# Patient Record
Sex: Male | Born: 1950 | Race: White | Hispanic: No | Marital: Married | Smoking: Never smoker
Health system: Southern US, Community
[De-identification: ages and names within clinical notes are randomized; demographics above are authoritative.]

## PROBLEM LIST (undated history)

## (undated) DIAGNOSIS — E119 Type 2 diabetes mellitus without complications: Secondary | ICD-10-CM

## (undated) DIAGNOSIS — I82409 Acute embolism and thrombosis of unspecified deep veins of unspecified lower extremity: Secondary | ICD-10-CM

## (undated) DIAGNOSIS — I10 Essential (primary) hypertension: Secondary | ICD-10-CM

## (undated) DIAGNOSIS — E785 Hyperlipidemia, unspecified: Secondary | ICD-10-CM

## (undated) DIAGNOSIS — I2699 Other pulmonary embolism without acute cor pulmonale: Secondary | ICD-10-CM

---

## 1980-10-27 HISTORY — PX: BACK SURGERY: SHX140

## 2000-04-30 ENCOUNTER — Encounter: Payer: Self-pay | Admitting: Gastroenterology

## 2003-09-01 ENCOUNTER — Encounter: Payer: Self-pay | Admitting: Gastroenterology

## 2006-10-27 HISTORY — PX: CERVICAL FUSION: SHX112

## 2006-11-29 ENCOUNTER — Emergency Department (HOSPITAL_COMMUNITY): Admission: EM | Admit: 2006-11-29 | Discharge: 2006-11-29 | Payer: Self-pay | Admitting: Family Medicine

## 2007-09-26 ENCOUNTER — Emergency Department (HOSPITAL_COMMUNITY): Admission: EM | Admit: 2007-09-26 | Discharge: 2007-09-26 | Payer: Self-pay | Admitting: Emergency Medicine

## 2007-09-29 ENCOUNTER — Ambulatory Visit (HOSPITAL_COMMUNITY): Admission: RE | Admit: 2007-09-29 | Discharge: 2007-09-29 | Payer: Self-pay | Admitting: Orthopedic Surgery

## 2007-11-04 ENCOUNTER — Encounter: Admission: RE | Admit: 2007-11-04 | Discharge: 2007-11-26 | Payer: Self-pay | Admitting: Neurology

## 2007-12-23 ENCOUNTER — Inpatient Hospital Stay (HOSPITAL_COMMUNITY): Admission: EM | Admit: 2007-12-23 | Discharge: 2007-12-29 | Payer: Self-pay | Admitting: Emergency Medicine

## 2007-12-24 ENCOUNTER — Ambulatory Visit: Payer: Self-pay | Admitting: Surgery

## 2008-03-09 ENCOUNTER — Ambulatory Visit: Payer: Self-pay | Admitting: Pulmonary Disease

## 2008-03-09 DIAGNOSIS — I10 Essential (primary) hypertension: Secondary | ICD-10-CM | POA: Insufficient documentation

## 2008-03-09 DIAGNOSIS — E119 Type 2 diabetes mellitus without complications: Secondary | ICD-10-CM | POA: Insufficient documentation

## 2008-03-09 DIAGNOSIS — R0602 Shortness of breath: Secondary | ICD-10-CM

## 2008-03-09 DIAGNOSIS — R519 Headache, unspecified: Secondary | ICD-10-CM | POA: Insufficient documentation

## 2008-03-09 DIAGNOSIS — J309 Allergic rhinitis, unspecified: Secondary | ICD-10-CM | POA: Insufficient documentation

## 2008-03-09 DIAGNOSIS — E785 Hyperlipidemia, unspecified: Secondary | ICD-10-CM

## 2008-03-09 DIAGNOSIS — R51 Headache: Secondary | ICD-10-CM

## 2008-03-14 ENCOUNTER — Encounter: Payer: Self-pay | Admitting: Pulmonary Disease

## 2008-03-14 ENCOUNTER — Ambulatory Visit: Payer: Self-pay

## 2008-03-15 ENCOUNTER — Ambulatory Visit: Payer: Self-pay | Admitting: Pulmonary Disease

## 2008-03-16 ENCOUNTER — Encounter: Payer: Self-pay | Admitting: Pulmonary Disease

## 2008-03-17 ENCOUNTER — Ambulatory Visit: Payer: Self-pay | Admitting: Pulmonary Disease

## 2008-03-17 DIAGNOSIS — I2699 Other pulmonary embolism without acute cor pulmonale: Secondary | ICD-10-CM

## 2008-08-15 ENCOUNTER — Ambulatory Visit: Payer: Self-pay | Admitting: Gastroenterology

## 2008-08-15 DIAGNOSIS — R933 Abnormal findings on diagnostic imaging of other parts of digestive tract: Secondary | ICD-10-CM | POA: Insufficient documentation

## 2008-08-21 LAB — CONVERTED CEMR LAB
Basophils Absolute: 0.1 10*3/uL (ref 0.0–0.1)
Basophils Relative: 1 % (ref 0.0–3.0)
CO2: 31 meq/L (ref 19–32)
Calcium: 8.8 mg/dL (ref 8.4–10.5)
Chloride: 101 meq/L (ref 96–112)
Eosinophils Absolute: 0.1 10*3/uL (ref 0.0–0.7)
Eosinophils Relative: 1.7 % (ref 0.0–5.0)
Glucose, Bld: 93 mg/dL (ref 70–99)
Hemoglobin: 12.1 g/dL — ABNORMAL LOW (ref 13.0–17.0)
MCHC: 33.9 g/dL (ref 30.0–36.0)
MCV: 80.1 fL (ref 78.0–100.0)
Monocytes Relative: 7.3 % (ref 3.0–12.0)
Platelets: 184 10*3/uL (ref 150–400)
Potassium: 4 meq/L (ref 3.5–5.1)
Prothrombin Time: 16 s — ABNORMAL HIGH (ref 10.9–13.3)
WBC: 6.8 10*3/uL (ref 4.5–10.5)

## 2008-08-31 ENCOUNTER — Encounter: Admission: RE | Admit: 2008-08-31 | Discharge: 2008-08-31 | Payer: Self-pay | Admitting: Family Medicine

## 2009-06-13 ENCOUNTER — Encounter (INDEPENDENT_AMBULATORY_CARE_PROVIDER_SITE_OTHER): Payer: Self-pay | Admitting: *Deleted

## 2010-05-09 ENCOUNTER — Telehealth: Payer: Self-pay | Admitting: Gastroenterology

## 2010-11-26 NOTE — Progress Notes (Signed)
Summary: Schedule Office Visit  Phone Note Outgoing Call Call back at [facility] Phone [phone]   Call placed by: Harlow Mares CMA Duncan Dull),  May 09, 2010 10:09 AM Call placed to: Patient Summary of Call: patients number is a text plus nuber and you can not leave a message or talk to a person. it is the only number listed anywhere in the chart. we will mail him a letter to advise him he is due for an office visit.  Initial call taken by: Harlow Mares CMA (AAMA),  May 09, 2010 10:10 AM

## 2011-03-11 NOTE — H&P (Signed)
NAME:  Joshua Fields, Joshua Fields NO.:  [id number]   MEDICAL RECORD NO.:  [id number]          PATIENT TYPE:  INP   LOCATION:  1827                         FACILITY:  [facility]   PHYSICIAN:  Altha Harm, MDDATE OF BIRTH:  08/04/1951   DATE OF ADMISSION:  12/23/2007  DATE OF DISCHARGE:                              HISTORY & PHYSICAL   CHIEF COMPLAINT:  Shortness of breath and dyspnea on exertion x2.   HISTORY OF PRESENT ILLNESS:  This is a 60 year old gentleman who  approximately 3 weeks ago had a cervical fusion performed in [place].  The patient states that he was feeling for approximately 1 week after  the surgery and then started having shortness of breath, which has  gotten progressively worse.  The patient states that he also is not  having dyspnea on exertion which is also getting worse.  Thus he  presented to the emergency room tonight.  In the emergency room the  patient was found to have pulmonary embolus on CT pulmonary angiogram.   The patient denies any fever or chills.  He denies any nausea, vomiting,  or diarrhea.  He denies any prior episodes of pulmonary embolus or blood  clots.   PAST MEDICAL HISTORY:  Significant for:  1. Hypertension.  2. Hyperlipidemia.  3. Chronic low back pain.  4. Anxiety, insomnia.   PAST SURGICAL HISTORY:  The patient has had multiple low back surgeries.  He has also had a C6-C7 fusion done on December 02, 2007, as noted above.   SOCIAL HISTORY:  The patient resides with his wife.  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  The patient denies any tobacco, alcohol or drug use.  The  patient's daughter, Dr. Loree Fee, is a pediatrician here in  [place], and Dr. Hollice Gong, family practitioner, [facility].   CURRENT MEDICATIONS:  Include the following:  1. Aspirin 81 mg daily.  2. Lovaza 2 g p.o. q.a.m. and p.m.  3. Flexeril 10 mg p.o. q.8 h. p.r.n.  4. Vicodin 1 tab p.o. q.4 h. p.r.n.  5. Lyrica 50 mg p.o. b.i.d.  6. Triamterene/hydrochlorothiazide 37.5/25 mg daily.  7. Vytorin 10/10 mg daily.  8. Trazodone 50 mg p.o. nightly.  9. Flonase on a p.r.n. basis.   PRIMARY CARE PHYSICIAN:  Dr.  Kathrynn Running   NEUROSURGEON:  Dr. Carney Living in [place].   REVIEW OF SYSTEMS:  Twelve systems are reviewed.  All systems aer  negative except as noted in the HPI.   PHYSICAL EXAMINATION:  The patient is sitting on the side of the bed and  appears to have shortness of breath with minimal activity.  Blood  pressure is 101/66, heart rate 82, respiratory rate 20, O2 sats are 98%  on 2 liters oxygen and temperature 97.7 orally.  GENERAL:  In general the patient is a morbidly obese gentleman.  HEENT:  He is normocephalic, atraumatic.  Pupils are equally around and  reactive to light and accommodation.  Extraocular movements aer intact.  Tympanic membranes translucent  bilaterally with good landmarks.  Oropharynx is moist.  No exudate, erythema, or lesions.  NECK:  The trachea is in the midline.  The patient has a scar on the  right side of the neck presumably where he had surgical procedure  performed from an anterior approach.  I am unable to appreciate any JVD  or carotid bruit secondary to the size of the patient's neck.  There  does not appear to be any masses or thyromegaly noted.  RESPIRATORY:  The patient has normal respiratory effort, equal excursion  bilaterally.  He has a rather obese chest wall anatomy.  I am unable to  appreciate any rales, rhonchi, or wheezes on auscultation.  CARDIOVASCULAR:  He is mildly tachycardic.  He has got a normal S1 and  S2.  No murmurs, rubs, or gallops are noted.  PMI is nondisplaced.  I am  unable to appreciate any heaves or thrills on palpation.  ABDOMEN:  Obese, soft, nontender, nondistended.  No masses, no  hepatosplenomegaly.  MUSCULOSKELETAL:  The patient has no warmth, swelling, or erythema  around the  joints.  He does have swelling of bilateral lower  extremities.  The patient has some tenderness along the cervical spine  and along the lumbar spine.  He has healed scars in the lower portion of  the lumbar spine.  NEUROLOGIC:  He has got cranial nerves 2-12 grossly intact.  There are  no focal neurological deficits.  DTRs are 2+ bilaterally, upper and  lower extremities.  Sensation is intact to light touch.  The patient was  able to move all four extremities symmetrical against gravity.  However,  given his obese habitus, he does have some relative general lack of  power in the bilateral upper and lower extremities.  PSYCHIATRIC:  He is alert and oriented x3.  He has good insight and  cognition.  Good recent and remote recall.  LYMPH NODE SURVEY:  He has got no cervical or axillary, inguinal  lymphadenopathy noted.   Studies done in the emergency room, he has got a sodium of 135,  potassium 3.6, chloride 102, BUN 17, creatinine 1.0. He has got a  hemoglobin of 13.9, hematocrit of 41, PT of 13.3, PTT of 28, and an INR  of 1.0.  CT angiogram of the chest shows large PE's bilaterally.   ASSESSMENT AND PLAN:  The patient presents with large bilateral  pulmonary emboli.  Likely this is related to the patient's surgical  procedure done approximately 3 weeks ago.  However, I will proceed with  a hypercoagulable panel on this patient and a homocystine level.  The  patient will be started on full dose heparin and anticoagulation.  The  patient will be admitted to a monitored bed at this time, given the  burden of the pulmonary embolus and need for closer monitoring.  We also  need to get a full CBC on this patient to evaluate his platelet level,  to have a baseline seeing that the patient will be on full dose heparin.  In terms of his hypertension and his hyperlipidemia, the patient will be  placed on his usual medications.  He will also be placed on his usual  pain medications and I will  give him an IV form of pain medication for  breakthrough pain.  The patient will put on a heart healthy diet and  will be on bed rest until he is therapeutic on his heparin.      Altha Harm, MD  Electronically Signed     MAM/MEDQ  D:  12/23/2007  T:  12/23/2007  Job:  [id number]

## 2011-03-11 NOTE — Discharge Summary (Signed)
NAME:  Joshua Fields, Joshua Fields NO.:  [id number]   MEDICAL RECORD NO.:  [id number]          PATIENT TYPE:  INP   LOCATION:  3733                         FACILITY:  [facility]   PHYSICIAN:  Ladell Pier, M.D.   DATE OF BIRTH:  August 10, 1951   DATE OF ADMISSION:  12/23/2007  DATE OF DISCHARGE:  12/29/2007                               DISCHARGE SUMMARY   DISCHARGE DIAGNOSES:  1. Large amount of pulmonary emboli bilaterally seen on CT scan on      December 23, 2007.  2. Hypertension.  3. Hyperlipidemia.  4. Chronic lower back pain.  5. Anxiety.  6. Insomnia.  7. Recent cervical fusion of the neck, surgery done in [place] by Dr.      Carney Living.   DISCHARGE MEDICATIONS:  1. Aspirin 81 mg daily.  2. Coumadin 10 mg Monday/Wednesday/Friday/Sunday and 7.5 mg all other      days.  3. Lovaza 2 mg in the morning and in the evening.  4. Flexeril 10 mg q.8 hours p.r.n.  5. Vicodin one tablet q.4 hours p.r.n.  6. Lyrica 50 mg b.i.d.  7. Triamterene hydrochlorothiazide 37.5/25 mg daily.  8. Vytorin 10 mg daily.  9. Trazodone 10 mg q.h.s.  10.Calcium daily.   FOLLOWUP:  The patient is to follow up with Dr. Kathrynn Running on Friday to get  INR checked.   PROCEDURE:  None.   CONSULTATIONS:  None.   HISTORY OF PRESENT ILLNESS:  The patient is a 60 year old gentleman that  approximately three weeks ago had cervical fusion performed in [place].  He states that he felt fine after that and then approximately a week  after the surgery he started feeling shortness of breath that has gotten  progressively worse.  He complains of dyspnea on exertion.  He had a CT  scan in the emergency room that showed PE.   PAST MEDICAL HISTORY:   FAMILY HISTORY:   SOCIAL HISTORY:   MEDICATIONS:   ALLERGIES:   REVIEW OF SYSTEMS:  Per admission H&P.   DISCHARGE PHYSICAL EXAMINATION:  VITAL SIGNS:  Temperature 97.5, pulse  79, respirations 22, blood pressure 124/81, pulse oximetry 94% on room  air.  HEENT:  Head is normocephalic and atraumatic.  Pupils reactive to light.  Throat without erythema.  CARDIOVASCULAR:  Regular rate and rhythm.  LUNGS:  Clear bilaterally.  ABDOMEN:  Positive bowel sounds.  EXTREMITIES:  Without edema.   HOSPITAL COURSE:  Problem 1.  Bilateral pulmonary emboli.  The patient  was admitted to the hospital, placed on IV heparin and then Coumadin was  added.  He remained in the hospital until his INR was therapeutic and  overlapped for two days.  The heparin was discontinued and he was  discharged on Coumadin.  He will follow up with his primary care  physician in two days for INR check and adjustment of his Coumadin.   Problem 2.  Hypertension.  He will continue on his home medications.   Problem 3.  Dyslipidemia.  He was also continued on his home medications  for his cholesterol.   Problem 4.  Chronic lower back pain.  He was continued on his pain  medications while he was in the hospital.  The pain was controlled.   DISCHARGE LABORATORY DATA:  Cardiac enzymes negative with CK 45, MB 1.0,  troponin 0.01.  PT 25.9, INR 2.3.  WBC 6.7, hemoglobin 12.9, platelets  203.  Hypercoagulable panel; antithrombin-3 93, protein C 107, protein S  184.  Lupus anticoagulant; none seen.   CT of the chest showed bilateral pulmonary embolism.  CT of the neck  negative for mass or adenopathy.      Ladell Pier, M.D.  Electronically Signed     N[place]/MEDQ  D:  12/29/2007  T:  12/29/2007  Job:  [id number]   cc:   Fax #[phone] Dr. Kathrynn Running at [facility] #[phone] Dr. Carney Living, [place]

## 2011-07-18 LAB — APTT: aPTT: 28

## 2011-07-18 LAB — LUPUS ANTICOAGULANT PANEL
Lupus Anticoagulant: NOT DETECTED
PTTLA Confirmation: 0 (ref <8.0)
dRVVT Incubated 1:1 Mix: 41.2 (ref 36.1–47.0)

## 2011-07-18 LAB — I-STAT 8, (EC8 V) (CONVERTED LAB)
Bicarbonate: 24.9 — ABNORMAL HIGH
Chloride: 102
HCT: 41
Hemoglobin: 13.9
pCO2, Ven: 37.4 — ABNORMAL LOW
pH, Ven: 7.43 — ABNORMAL HIGH

## 2011-07-18 LAB — CBC
HCT: 36.5 — ABNORMAL LOW
Hemoglobin: 12.2 — ABNORMAL LOW
MCHC: 33.3
Platelets: 181
RBC: 4.45
RDW: 14.3
WBC: 6.2
WBC: 7.3

## 2011-07-18 LAB — CULTURE, BLOOD (ROUTINE X 2): Culture: NO GROWTH

## 2011-07-18 LAB — PROTIME-INR
INR: 1
INR: 1
INR: 1
Prothrombin Time: 13.1
Prothrombin Time: 13.3
Prothrombin Time: 13.6

## 2011-07-18 LAB — PROTHROMBIN GENE MUTATION

## 2011-07-18 LAB — PROTEIN C ACTIVITY: Protein C Activity: 161 % — ABNORMAL HIGH (ref 75–133)

## 2011-07-18 LAB — HOMOCYSTEINE: Homocysteine: 15.2

## 2011-07-18 LAB — ANTITHROMBIN III: AntiThromb III Func: 93 (ref 76–126)

## 2011-07-18 LAB — FACTOR 5 LEIDEN

## 2011-07-21 LAB — PROTIME-INR
INR: 1.1
INR: 1.7 — ABNORMAL HIGH
INR: 1.8 — ABNORMAL HIGH
INR: 2 — ABNORMAL HIGH
Prothrombin Time: 14.4
Prothrombin Time: 20.3 — ABNORMAL HIGH
Prothrombin Time: 21.4 — ABNORMAL HIGH
Prothrombin Time: 25.9 — ABNORMAL HIGH

## 2011-07-21 LAB — CBC
HCT: 35.1 — ABNORMAL LOW
HCT: 36.9 — ABNORMAL LOW
HCT: 38.9 — ABNORMAL LOW
HCT: 39.2
Hemoglobin: 11.9 — ABNORMAL LOW
MCHC: 33.3
MCV: 82.5
MCV: 83
Platelets: 192
Platelets: 199
Platelets: 203
RBC: 4.45
RDW: 14.6
RDW: 14.7
WBC: 6.1
WBC: 7
WBC: 7.1

## 2011-07-21 LAB — BASIC METABOLIC PANEL
BUN: 15
Chloride: 98
Creatinine, Ser: 0.79
GFR calc Af Amer: >60
GFR calc non Af Amer: >60
Potassium: 4

## 2011-07-21 LAB — CARDIAC PANEL(CRET KIN+CKTOT+MB+TROPI)
Relative Index: INVALID
Relative Index: INVALID
Troponin I: 0.02

## 2011-08-04 LAB — CREATININE, SERUM
Creatinine, Ser: 0.84
GFR calc Af Amer: >60
GFR calc non Af Amer: >60

## 2012-03-12 ENCOUNTER — Encounter: Payer: Self-pay | Admitting: Gastroenterology

## 2014-08-27 ENCOUNTER — Emergency Department (HOSPITAL_COMMUNITY): Payer: Medicare Other | Admitting: Anesthesiology

## 2014-08-27 ENCOUNTER — Emergency Department (HOSPITAL_COMMUNITY): Payer: Medicare Other

## 2014-08-27 ENCOUNTER — Encounter (HOSPITAL_COMMUNITY): Payer: Self-pay | Admitting: *Deleted

## 2014-08-27 ENCOUNTER — Inpatient Hospital Stay (HOSPITAL_COMMUNITY)
Admission: EM | Admit: 2014-08-27 | Discharge: 2014-08-30 | DRG: 493 | Disposition: A | Discharge status: Rehabilitation Facility | Payer: Medicare Other | Attending: Orthopedic Surgery | Admitting: Orthopedic Surgery

## 2014-08-27 ENCOUNTER — Encounter (HOSPITAL_COMMUNITY)
Admission: EM | Disposition: A | Discharge status: Rehabilitation Facility | Payer: Self-pay | Source: Home / Self Care | Attending: Orthopedic Surgery

## 2014-08-27 DIAGNOSIS — S82252A Displaced comminuted fracture of shaft of left tibia, initial encounter for closed fracture: Secondary | ICD-10-CM | POA: Diagnosis present

## 2014-08-27 DIAGNOSIS — D62 Acute posthemorrhagic anemia: Secondary | ICD-10-CM | POA: Diagnosis not present

## 2014-08-27 DIAGNOSIS — E785 Hyperlipidemia, unspecified: Secondary | ICD-10-CM | POA: Diagnosis present

## 2014-08-27 DIAGNOSIS — S82452A Displaced comminuted fracture of shaft of left fibula, initial encounter for closed fracture: Secondary | ICD-10-CM | POA: Diagnosis present

## 2014-08-27 DIAGNOSIS — S82292D Other fracture of shaft of left tibia, subsequent encounter for closed fracture with routine healing: Secondary | ICD-10-CM | POA: Diagnosis present

## 2014-08-27 DIAGNOSIS — F17220 Nicotine dependence, chewing tobacco, uncomplicated: Secondary | ICD-10-CM | POA: Diagnosis present

## 2014-08-27 DIAGNOSIS — Z86718 Personal history of other venous thrombosis and embolism: Secondary | ICD-10-CM | POA: Diagnosis not present

## 2014-08-27 DIAGNOSIS — S82832D Other fracture of upper and lower end of left fibula, subsequent encounter for closed fracture with routine healing: Secondary | ICD-10-CM | POA: Diagnosis not present

## 2014-08-27 DIAGNOSIS — Z79899 Other long term (current) drug therapy: Secondary | ICD-10-CM | POA: Diagnosis not present

## 2014-08-27 DIAGNOSIS — Z6841 Body Mass Index (BMI) 40.0 and over, adult: Secondary | ICD-10-CM | POA: Diagnosis not present

## 2014-08-27 DIAGNOSIS — B9620 Unspecified Escherichia coli [E. coli] as the cause of diseases classified elsewhere: Secondary | ICD-10-CM | POA: Diagnosis present

## 2014-08-27 DIAGNOSIS — Z981 Arthrodesis status: Secondary | ICD-10-CM | POA: Diagnosis not present

## 2014-08-27 DIAGNOSIS — Z7901 Long term (current) use of anticoagulants: Secondary | ICD-10-CM | POA: Diagnosis not present

## 2014-08-27 DIAGNOSIS — K5900 Constipation, unspecified: Secondary | ICD-10-CM | POA: Diagnosis not present

## 2014-08-27 DIAGNOSIS — S82202S Unspecified fracture of shaft of left tibia, sequela: Secondary | ICD-10-CM | POA: Diagnosis not present

## 2014-08-27 DIAGNOSIS — M79605 Pain in left leg: Secondary | ICD-10-CM | POA: Diagnosis present

## 2014-08-27 DIAGNOSIS — Z86711 Personal history of pulmonary embolism: Secondary | ICD-10-CM

## 2014-08-27 DIAGNOSIS — E1142 Type 2 diabetes mellitus with diabetic polyneuropathy: Secondary | ICD-10-CM | POA: Diagnosis present

## 2014-08-27 DIAGNOSIS — I10 Essential (primary) hypertension: Secondary | ICD-10-CM | POA: Diagnosis present

## 2014-08-27 DIAGNOSIS — S82209A Unspecified fracture of shaft of unspecified tibia, initial encounter for closed fracture: Secondary | ICD-10-CM | POA: Diagnosis present

## 2014-08-27 DIAGNOSIS — N390 Urinary tract infection, site not specified: Secondary | ICD-10-CM | POA: Diagnosis present

## 2014-08-27 DIAGNOSIS — S2249XD Multiple fractures of ribs, unspecified side, subsequent encounter for fracture with routine healing: Secondary | ICD-10-CM | POA: Diagnosis not present

## 2014-08-27 DIAGNOSIS — S82402A Unspecified fracture of shaft of left fibula, initial encounter for closed fracture: Secondary | ICD-10-CM | POA: Diagnosis present

## 2014-08-27 DIAGNOSIS — W19XXXA Unspecified fall, initial encounter: Secondary | ICD-10-CM

## 2014-08-27 DIAGNOSIS — M961 Postlaminectomy syndrome, not elsewhere classified: Secondary | ICD-10-CM | POA: Diagnosis not present

## 2014-08-27 DIAGNOSIS — W14XXXD Fall from tree, subsequent encounter: Secondary | ICD-10-CM | POA: Diagnosis not present

## 2014-08-27 DIAGNOSIS — W14XXXA Fall from tree, initial encounter: Secondary | ICD-10-CM | POA: Diagnosis present

## 2014-08-27 DIAGNOSIS — S82202A Unspecified fracture of shaft of left tibia, initial encounter for closed fracture: Secondary | ICD-10-CM

## 2014-08-27 DIAGNOSIS — Z9889 Other specified postprocedural states: Secondary | ICD-10-CM | POA: Diagnosis not present

## 2014-08-27 HISTORY — DX: Acute embolism and thrombosis of unspecified deep veins of unspecified lower extremity: I82.409

## 2014-08-27 HISTORY — PX: TIBIA IM NAIL INSERTION: SHX2516

## 2014-08-27 HISTORY — DX: Other pulmonary embolism without acute cor pulmonale: I26.99

## 2014-08-27 HISTORY — DX: Essential (primary) hypertension: I10

## 2014-08-27 HISTORY — DX: Hyperlipidemia, unspecified: E78.5

## 2014-08-27 HISTORY — DX: Type 2 diabetes mellitus without complications: E11.9

## 2014-08-27 LAB — COMPREHENSIVE METABOLIC PANEL
ALBUMIN: 3.7 g/dL (ref 3.5–5.2)
ALK PHOS: 58 U/L (ref 39–117)
ALT: 16 U/L (ref 0–53)
AST: 24 U/L (ref 0–37)
Anion gap: 12 (ref 5–15)
BILIRUBIN TOTAL: 0.4 mg/dL (ref 0.3–1.2)
BUN: 18 mg/dL (ref 6–23)
CHLORIDE: 100 meq/L (ref 96–112)
CO2: 27 meq/L (ref 19–32)
CREATININE: 0.78 mg/dL (ref 0.50–1.35)
Calcium: 8.9 mg/dL (ref 8.4–10.5)
GFR calc Af Amer: >90 mL/min (ref >90)
Glucose, Bld: 108 mg/dL — ABNORMAL HIGH (ref 70–99)
POTASSIUM: 4.4 meq/L (ref 3.7–5.3)
Sodium: 139 mEq/L (ref 137–147)
Total Protein: 6.7 g/dL (ref 6.0–8.3)

## 2014-08-27 LAB — ETHANOL: Alcohol, Ethyl (B): <11 mg/dL (ref 0–11)

## 2014-08-27 LAB — URINALYSIS, ROUTINE W REFLEX MICROSCOPIC
BILIRUBIN URINE: NEGATIVE
Glucose, UA: NEGATIVE mg/dL
Hgb urine dipstick: NEGATIVE
KETONES UR: NEGATIVE mg/dL
Leukocytes, UA: NEGATIVE
Nitrite: NEGATIVE
PH: 5.5 (ref 5.0–8.0)
Protein, ur: NEGATIVE mg/dL
Specific Gravity, Urine: 1.023 (ref 1.005–1.030)
UROBILINOGEN UA: 0.2 mg/dL (ref 0.0–1.0)

## 2014-08-27 LAB — CBC
HEMATOCRIT: 37 % — AB (ref 39.0–52.0)
Hemoglobin: 12.7 g/dL — ABNORMAL LOW (ref 13.0–17.0)
MCH: 32.6 pg (ref 26.0–34.0)
MCHC: 34.3 g/dL (ref 30.0–36.0)
MCV: 95.1 fL (ref 78.0–100.0)
PLATELETS: 181 10*3/uL (ref 150–400)
RBC: 3.89 MIL/uL — ABNORMAL LOW (ref 4.22–5.81)
RDW: 13.5 % (ref 11.5–15.5)
WBC: 8.5 10*3/uL (ref 4.0–10.5)

## 2014-08-27 LAB — SAMPLE TO BLOOD BANK

## 2014-08-27 LAB — PROTIME-INR
INR: 1.89 — AB (ref 0.00–1.49)
Prothrombin Time: 21.9 seconds — ABNORMAL HIGH (ref 11.6–15.2)

## 2014-08-27 LAB — CDS SEROLOGY

## 2014-08-27 SURGERY — INSERTION, INTRAMEDULLARY ROD, TIBIA
Anesthesia: General | Site: Leg Lower | Laterality: Left

## 2014-08-27 MED ORDER — MEPERIDINE HCL 25 MG/ML IJ SOLN
6.2500 mg | INTRAMUSCULAR | Status: DC | PRN
Start: 2014-08-27 — End: 2014-08-28
  Administered 2014-08-27: 12.5 mg via INTRAVENOUS

## 2014-08-27 MED ORDER — HYDROMORPHONE HCL 1 MG/ML IJ SOLN
1.0000 mg | Freq: Once | INTRAMUSCULAR | Status: AC
  Administered 2014-08-27: 1 mg via INTRAVENOUS
  Filled 2014-08-27: qty 1

## 2014-08-27 MED ORDER — HYDROMORPHONE HCL 1 MG/ML IJ SOLN
0.2500 mg | INTRAMUSCULAR | Status: DC | PRN
  Administered 2014-08-27 – 2014-08-28 (×4): 0.5 mg via INTRAVENOUS

## 2014-08-27 MED ORDER — 0.9 % SODIUM CHLORIDE (POUR BTL) OPTIME
TOPICAL | Status: DC | PRN
  Administered 2014-08-27: 1000 mL

## 2014-08-27 MED ORDER — ONDANSETRON HCL 4 MG/2ML IJ SOLN
INTRAMUSCULAR | Status: DC | PRN
  Administered 2014-08-27: 4 mg via INTRAVENOUS

## 2014-08-27 MED ORDER — HYDROMORPHONE HCL 1 MG/ML IJ SOLN
1.0000 mg | INTRAMUSCULAR | Status: DC | PRN
  Administered 2014-08-27 (×2): 1 mg via INTRAVENOUS
  Filled 2014-08-27 (×3): qty 1

## 2014-08-27 MED ORDER — MIDAZOLAM HCL 2 MG/2ML IJ SOLN
INTRAMUSCULAR | Status: DC | PRN
Start: 2014-08-27 — End: 2014-08-27
  Administered 2014-08-27: 2 mg via INTRAVENOUS

## 2014-08-27 MED ORDER — NEOSTIGMINE METHYLSULFATE 10 MG/10ML IV SOLN
INTRAVENOUS | Status: AC
  Filled 2014-08-27: qty 1

## 2014-08-27 MED ORDER — ONDANSETRON HCL 4 MG/2ML IJ SOLN
INTRAMUSCULAR | Status: AC
  Filled 2014-08-27: qty 2

## 2014-08-27 MED ORDER — ALBUTEROL SULFATE (2.5 MG/3ML) 0.083% IN NEBU
2.5200 mg | INHALATION_SOLUTION | Freq: Four times a day (QID) | RESPIRATORY_TRACT | Status: DC | PRN
Start: 2014-08-27 — End: 2014-08-30

## 2014-08-27 MED ORDER — MIDAZOLAM HCL 2 MG/2ML IJ SOLN
INTRAMUSCULAR | Status: AC
  Filled 2014-08-27: qty 2

## 2014-08-27 MED ORDER — IOHEXOL 300 MG/ML  SOLN
100.0000 mL | Freq: Once | INTRAMUSCULAR | Status: AC | PRN
  Administered 2014-08-27: 100 mL via INTRAVENOUS

## 2014-08-27 MED ORDER — LACTATED RINGERS IV SOLN
INTRAVENOUS | Status: DC | PRN
  Administered 2014-08-27: 22:00:00 via INTRAVENOUS

## 2014-08-27 MED ORDER — GLYCOPYRROLATE 0.2 MG/ML IJ SOLN
INTRAMUSCULAR | Status: DC | PRN
  Administered 2014-08-27: 0.6 mg via INTRAVENOUS

## 2014-08-27 MED ORDER — OXYCODONE HCL 5 MG/5ML PO SOLN: 5.0000 mg | Freq: Once | ORAL | Status: AC | PRN

## 2014-08-27 MED ORDER — HYDROMORPHONE HCL 1 MG/ML IJ SOLN
INTRAMUSCULAR | Status: AC
  Administered 2014-08-27: 1 mg
  Filled 2014-08-27: qty 1

## 2014-08-27 MED ORDER — SODIUM CHLORIDE 0.9 % IV SOLN
INTRAVENOUS | Status: DC | PRN
  Administered 2014-08-27: 22:00:00 via INTRAVENOUS

## 2014-08-27 MED ORDER — SODIUM CHLORIDE 0.9 % IV SOLN
INTRAVENOUS | Status: DC
  Administered 2014-08-28 – 2014-08-29 (×3): via INTRAVENOUS

## 2014-08-27 MED ORDER — ROCURONIUM BROMIDE 50 MG/5ML IV SOLN
INTRAVENOUS | Status: AC
  Filled 2014-08-27: qty 1

## 2014-08-27 MED ORDER — FENTANYL CITRATE 0.05 MG/ML IJ SOLN
INTRAMUSCULAR | Status: DC | PRN
  Administered 2014-08-27 (×3): 50 ug via INTRAVENOUS
  Administered 2014-08-27: 100 ug via INTRAVENOUS

## 2014-08-27 MED ORDER — HYDROMORPHONE HCL 1 MG/ML IJ SOLN
1.0000 mg | Freq: Once | INTRAMUSCULAR | Status: AC
Start: 2014-08-27 — End: 2014-08-27
  Administered 2014-08-27: 1 mg via INTRAVENOUS

## 2014-08-27 MED ORDER — FENTANYL CITRATE 0.05 MG/ML IJ SOLN
INTRAMUSCULAR | Status: AC
  Filled 2014-08-27: qty 5

## 2014-08-27 MED ORDER — SODIUM CHLORIDE 0.9 % IV SOLN: Freq: Once | INTRAVENOUS | Status: DC

## 2014-08-27 MED ORDER — ROCURONIUM BROMIDE 100 MG/10ML IV SOLN
INTRAVENOUS | Status: DC | PRN
  Administered 2014-08-27: 20 mg via INTRAVENOUS
  Administered 2014-08-27: 30 mg via INTRAVENOUS

## 2014-08-27 MED ORDER — PROPOFOL 10 MG/ML IV BOLUS
INTRAVENOUS | Status: DC | PRN
  Administered 2014-08-27: 150 mg via INTRAVENOUS

## 2014-08-27 MED ORDER — SUCCINYLCHOLINE CHLORIDE 20 MG/ML IJ SOLN
INTRAMUSCULAR | Status: AC
  Filled 2014-08-27: qty 1

## 2014-08-27 MED ORDER — CEFAZOLIN SODIUM-DEXTROSE 2-3 GM-% IV SOLR
INTRAVENOUS | Status: AC
  Administered 2014-08-27: 2 g via INTRAVENOUS
  Filled 2014-08-27: qty 50

## 2014-08-27 MED ORDER — MIDAZOLAM HCL 2 MG/2ML IJ SOLN: 0.5000 mg | Freq: Once | INTRAMUSCULAR | Status: AC | PRN

## 2014-08-27 MED ORDER — OXYCODONE HCL 5 MG PO TABS: 5.0000 mg | ORAL_TABLET | Freq: Once | ORAL | Status: AC | PRN

## 2014-08-27 MED ORDER — PROMETHAZINE HCL 25 MG/ML IJ SOLN: 6.2500 mg | INTRAMUSCULAR | Status: DC | PRN

## 2014-08-27 MED ORDER — HYDROMORPHONE HCL 1 MG/ML IJ SOLN: 1.0000 mg | Freq: Once | INTRAMUSCULAR | Status: DC

## 2014-08-27 MED ORDER — SUCCINYLCHOLINE CHLORIDE 20 MG/ML IJ SOLN
INTRAMUSCULAR | Status: DC | PRN
  Administered 2014-08-27: 100 mg via INTRAVENOUS

## 2014-08-27 MED ORDER — NEOSTIGMINE METHYLSULFATE 10 MG/10ML IV SOLN
INTRAVENOUS | Status: DC | PRN
  Administered 2014-08-27: 4 mg via INTRAVENOUS

## 2014-08-27 MED ORDER — LIDOCAINE HCL (CARDIAC) 20 MG/ML IV SOLN
INTRAVENOUS | Status: AC
  Filled 2014-08-27: qty 5

## 2014-08-27 MED ORDER — HYDROMORPHONE HCL 1 MG/ML IJ SOLN
INTRAMUSCULAR | Status: AC
  Filled 2014-08-27: qty 1

## 2014-08-27 MED ORDER — PROPOFOL 10 MG/ML IV BOLUS
INTRAVENOUS | Status: AC
  Filled 2014-08-27: qty 20

## 2014-08-27 MED ORDER — LIDOCAINE HCL (CARDIAC) 20 MG/ML IV SOLN
INTRAVENOUS | Status: DC | PRN
  Administered 2014-08-27: 10 mg via INTRAVENOUS

## 2014-08-27 MED ORDER — GLYCOPYRROLATE 0.2 MG/ML IJ SOLN
INTRAMUSCULAR | Status: AC
  Filled 2014-08-27: qty 3

## 2014-08-27 MED ORDER — MEPERIDINE HCL 25 MG/ML IJ SOLN
INTRAMUSCULAR | Status: AC
  Filled 2014-08-27: qty 1

## 2014-08-27 SURGICAL SUPPLY — 64 items
BANDAGE ELASTIC 4 VELCRO ST LF (GAUZE/BANDAGES/DRESSINGS) IMPLANT
BANDAGE ELASTIC 6 VELCRO ST LF (GAUZE/BANDAGES/DRESSINGS) ×6 IMPLANT
BANDAGE ESMARK 6X9 LF (GAUZE/BANDAGES/DRESSINGS) ×1 IMPLANT
BIT DRILL 3.8X6 NS (BIT) ×3 IMPLANT
BIT DRILL 4.4 NS (BIT) ×3 IMPLANT
BNDG CMPR 9X6 STRL LF SNTH (GAUZE/BANDAGES/DRESSINGS) ×1
BNDG COHESIVE 4X5 TAN STRL (GAUZE/BANDAGES/DRESSINGS) IMPLANT
BNDG COHESIVE 6X5 TAN STRL LF (GAUZE/BANDAGES/DRESSINGS) IMPLANT
BNDG ESMARK 6X9 LF (GAUZE/BANDAGES/DRESSINGS) ×3
COVER SURGICAL LIGHT HANDLE (MISCELLANEOUS) ×3 IMPLANT
CUFF TOURNIQUET SINGLE 34IN LL (TOURNIQUET CUFF) ×3 IMPLANT
CUFF TOURNIQUET SINGLE 44IN (TOURNIQUET CUFF) IMPLANT
DRAPE C-ARM 42X72 X-RAY (DRAPES) ×3 IMPLANT
DRAPE C-ARMOR (DRAPES) ×3 IMPLANT
DRAPE IMP U-DRAPE 54X76 (DRAPES) ×3 IMPLANT
DRAPE ORTHO SPLIT 77X108 STRL (DRAPES) ×3
DRAPE SURG ORHT 6 SPLT 77X108 (DRAPES) ×1 IMPLANT
DRAPE U-SHAPE 47X51 STRL (DRAPES) ×3 IMPLANT
DRSG ADAPTIC 3X8 NADH LF (GAUZE/BANDAGES/DRESSINGS) ×3 IMPLANT
DRSG PAD ABDOMINAL 8X10 ST (GAUZE/BANDAGES/DRESSINGS) IMPLANT
DURAPREP 26ML APPLICATOR (WOUND CARE) ×3 IMPLANT
ELECT REM PT RETURN 9FT ADLT (ELECTROSURGICAL) ×3
ELECTRODE REM PT RTRN 9FT ADLT (ELECTROSURGICAL) ×1 IMPLANT
GAUZE SPONGE 4X4 12PLY STRL (GAUZE/BANDAGES/DRESSINGS) IMPLANT
GLOVE BIO SURGEON STRL SZ7 (GLOVE) IMPLANT
GLOVE BIO SURGEON STRL SZ8 (GLOVE) ×3 IMPLANT
GLOVE BIOGEL PI IND STRL 7.0 (GLOVE) ×1 IMPLANT
GLOVE BIOGEL PI IND STRL 8 (GLOVE) ×2 IMPLANT
GLOVE BIOGEL PI INDICATOR 7.0 (GLOVE) ×2
GLOVE BIOGEL PI INDICATOR 8 (GLOVE) ×4
GLOVE SURG SS PI 7.0 STRL IVOR (GLOVE) ×3 IMPLANT
GOWN STRL REUS W/ TWL LRG LVL3 (GOWN DISPOSABLE) ×2 IMPLANT
GOWN STRL REUS W/TWL LRG LVL3 (GOWN DISPOSABLE) ×6
GUIDEWIRE BALL NOSE 80CM (WIRE) ×3 IMPLANT
KIT BASIN OR (CUSTOM PROCEDURE TRAY) ×3 IMPLANT
KIT ROOM TURNOVER OR (KITS) ×3 IMPLANT
NAIL TIBIAL 10MMX36CM (Nail) ×3 IMPLANT
PACK ORTHO EXTREMITY (CUSTOM PROCEDURE TRAY) ×3 IMPLANT
PACK UNIVERSAL I (CUSTOM PROCEDURE TRAY) ×3 IMPLANT
PAD ABD 8X10 STRL (GAUZE/BANDAGES/DRESSINGS) ×6 IMPLANT
PAD ARMBOARD 7.5X6 YLW CONV (MISCELLANEOUS) ×6 IMPLANT
PAD CAST 4YDX4 CTTN HI CHSV (CAST SUPPLIES) ×2 IMPLANT
PADDING CAST COTTON 4X4 STRL (CAST SUPPLIES) ×6
PADDING CAST COTTON 6X4 STRL (CAST SUPPLIES) IMPLANT
PIN GUIDE 3.2X14 1401214 (MISCELLANEOUS) ×3 IMPLANT
SCREW ACECAP 38MM (Screw) ×3 IMPLANT
SCREW ACECAP 52MM (Screw) ×3 IMPLANT
SCREW LOCK PROX 5.5X65 151565 (Screw) ×3 IMPLANT
SCREW PROXIMAL DEPUY (Screw) ×3 IMPLANT
SCREW PRXML FT 60X5.5XNS LF (Screw) ×1 IMPLANT
SPONGE GAUZE 4X4 12PLY STER LF (GAUZE/BANDAGES/DRESSINGS) ×3 IMPLANT
SPONGE LAP 18X18 X RAY DECT (DISPOSABLE) ×3 IMPLANT
STAPLER VISISTAT 35W (STAPLE) IMPLANT
SUT ETHILON 3 0 PS 1 (SUTURE) ×6 IMPLANT
SUT MNCRL AB 3-0 PS2 18 (SUTURE) ×3 IMPLANT
SUT MON AB 2-0 CT1 27 (SUTURE) ×3 IMPLANT
SUT VIC AB 0 CT1 27 (SUTURE) ×3
SUT VIC AB 0 CT1 27XBRD ANBCTR (SUTURE) ×1 IMPLANT
TOWEL OR 17X24 6PK STRL BLUE (TOWEL DISPOSABLE) ×3 IMPLANT
TOWEL OR 17X26 10 PK STRL BLUE (TOWEL DISPOSABLE) ×3 IMPLANT
TUBE CONNECTING 12'X1/4 (SUCTIONS) ×1
TUBE CONNECTING 12X1/4 (SUCTIONS) ×2 IMPLANT
UNDERPAD 30X30 INCONTINENT (UNDERPADS AND DIAPERS) ×3 IMPLANT
YANKAUER SUCT BULB TIP NO VENT (SUCTIONS) ×3 IMPLANT

## 2014-08-27 NOTE — Anesthesia Preprocedure Evaluation (Addendum)
Anesthesia Evaluation  Patient identified by MRN, date of birth, ID band Patient awake    Reviewed: Allergy & Precautions, H&P , NPO status , Patient's Chart, lab work & pertinent test resultsPreop documentation limited or incomplete due to emergent nature of procedure.  History of Anesthesia Complications Negative for: history of anesthetic complications  Airway Mallampati: II  TM Distance: >3 FB Neck ROM: Full    Dental  (+) Poor Dentition, Dental Advisory Given   Pulmonary PE (2009) breath sounds clear to auscultation        Cardiovascular hypertension, Pt. on medications DVT Rhythm:Regular Rate:Normal  '09 ECHO: normal LVF, valves OK   Neuro/Psych Chronic back pain: narcotics C 6,7 dorsal column stimulator     GI/Hepatic negative GI ROS, Neg liver ROS,   Endo/Other  diabetes, Oral Hypoglycemic AgentsMorbid obesity  Renal/GU negative Renal ROS     Musculoskeletal   Abdominal (+) + obese,   Peds  Hematology  (+) Blood dyscrasia (INR 1.89, Coumadin), ,   Anesthesia Other Findings   Reproductive/Obstetrics                          Anesthesia Physical Anesthesia Plan  ASA: III and emergent  Anesthesia Plan: General   Post-op Pain Management:    Induction: Intravenous and Rapid sequence  Airway Management Planned: Oral ETT and Video Laryngoscope Planned  Additional Equipment:   Intra-op Plan:   Post-operative Plan: Extubation in OR  Informed Consent: I have reviewed the patients History and Physical, chart, labs and discussed the procedure including the risks, benefits and alternatives for the proposed anesthesia with the patient or authorized representative who has indicated his/her understanding and acceptance.   Dental advisory given  Plan Discussed with: CRNA and Surgeon  Anesthesia Plan Comments: (Plan routine monitors, GETA with VideoGlide intubation)         Anesthesia Quick Evaluation

## 2014-08-27 NOTE — ED Notes (Signed)
The pts family has been told by the edp that a bone doctor has been called and will see and that the edp has ordered a c-t of his chest   Due to rib fractures.  The pt remains alert only c/o  His lt lower leg pain

## 2014-08-27 NOTE — ED Notes (Signed)
Daughter at the bedside

## 2014-08-27 NOTE — ED Provider Notes (Signed)
CSN: [id number]     Arrival date & time 08/27/14  1646 History   First MD Initiated Contact with Patient 08/27/14 1716     Chief Complaint  Patient presents with  . Fall     (Consider location/radiation/quality/duration/timing/severity/associated sxs/prior Treatment) HPI Comments: 63 year old male with history of blood clots on Coumadin who presents after a fall from a deer stand from approximately 12 feet. He complains only of left leg pain. He states that he landed completely on this leg. He does endorse bumping his head, without loss of consciousness.  Patient is a 63 y.o. male presenting with fall.  Fall This is a new problem. The current episode started 1 to 2 hours ago. Episode frequency: Once. Associated symptoms comments: Left lower extremity pain. Exacerbated by: movement. Relieved by: Fentanyl given by EMS.    Past Medical History  Diagnosis Date  . Diabetes mellitus without complication   . Hypertension   . PE (pulmonary embolism)   . DVT (deep venous thrombosis)    History reviewed. No pertinent past surgical history. No family history on file. History  Substance Use Topics  . Smoking status: Never Smoker   . Smokeless tobacco: Not on file  . Alcohol Use: No    Review of Systems  All other systems reviewed and are negative.     Allergies  Review of patient's allergies indicates no known allergies.  Home Medications   Prior to Admission medications   Not on File   BP 119/65 mmHg  Pulse 75  Temp(Src) 98.7 F (37.1 C)  Resp 22  Ht 5\' 7"  (1.702 m)  Wt 264 lb (119.75 kg)  BMI 41.34 kg/m2  SpO2 100% Physical Exam  Constitutional: He is oriented to person, place, and time. He appears well-developed and well-nourished. No distress.  HENT:  Head: Normocephalic and atraumatic. Head is without raccoon's eyes and without Battle's sign.  Nose: Nose normal.  Eyes: Conjunctivae and EOM are normal. Pupils are equal, round, and reactive to light. No scleral  icterus.  Neck: No spinous process tenderness and no muscular tenderness present.  Cardiovascular: Normal rate, regular rhythm, normal heart sounds and intact distal pulses.   No murmur heard. Pulmonary/Chest: Effort normal and breath sounds normal. He has no rales. He exhibits no tenderness.  Abdominal: Soft. There is no tenderness. There is no rebound and no guarding.  Musculoskeletal: Normal range of motion. He exhibits no edema.       Left ankle: He exhibits swelling and deformity. He exhibits no laceration and normal pulse. Tenderness.       Thoracic back: He exhibits no tenderness and no bony tenderness.       Lumbar back: He exhibits no tenderness and no bony tenderness.  No evidence of trauma to extremities, except as noted.  2+ distal pulses.    Neurological: He is alert and oriented to person, place, and time.  Skin: Skin is warm and dry. No rash noted.  Psychiatric: He has a normal mood and affect.  Nursing note and vitals reviewed.   ED Course  CRITICAL CARE Performed by: Warnell Forester Authorized by: Warnell Forester Total critical care time: 40 minutes Critical care time was exclusive of separately billable procedures and treating other patients. Critical care was necessary to treat or prevent imminent or life-threatening deterioration of the following conditions: trauma. Critical care was time spent personally by me on the following activities: development of treatment plan with patient or surrogate, discussions with consultants, evaluation of patient's response to  treatment, examination of patient, obtaining history from patient or surrogate, ordering and performing treatments and interventions, ordering and review of laboratory studies, ordering and review of radiographic studies, pulse oximetry, re-evaluation of patient's condition and review of old charts.   (including critical care time) Labs Review Labs Reviewed  COMPREHENSIVE METABOLIC PANEL - Abnormal; Notable for the  following:    Glucose, Bld 108 (*)    All other components within normal limits  CBC - Abnormal; Notable for the following:    RBC 3.89 (*)    Hemoglobin 12.7 (*)    HCT 37.0 (*)    All other components within normal limits  PROTIME-INR - Abnormal; Notable for the following:    Prothrombin Time 21.9 (*)    INR 1.89 (*)    All other components within normal limits  CDS SEROLOGY  ETHANOL  URINALYSIS, ROUTINE W REFLEX MICROSCOPIC  SAMPLE TO BLOOD BANK    Imaging Review Dg Tibia/fibula Left  08/27/2014   CLINICAL DATA:  Fall from tree stand with leg pain  EXAM: LEFT TIBIA AND FIBULA - 2 VIEW  COMPARISON:  None.  FINDINGS: There are multiple fracture is identified. There is a transverse fracture through the mid the distal tibial diaphysis as well as a diffusely comminuted fracture of the distal fibular metaphysis. Additionally an undisplaced fracture is noted in the proximal tibia. Soft tissue swelling is noted. No disruption of the ankle joint is noted.  IMPRESSION: Multiple tibial and fibular fractures.   Electronically Signed   By: Alcide CleverMark  Lukens M.D.   On: 08/27/2014 18:20   Ct Head Wo Contrast  08/27/2014   CLINICAL DATA:  Fall of a tree stand. Prior neck surgery 12 years ago.  EXAM: CT HEAD WITHOUT CONTRAST  CT CERVICAL SPINE WITHOUT CONTRAST  TECHNIQUE: Multidetector CT imaging of the head and cervical spine was performed following the standard protocol without intravenous contrast. Multiplanar CT image reconstructions of the cervical spine were also generated.  COMPARISON:  Multiple exams, including 12/23/2007 and 09/29/2007  FINDINGS: CT HEAD FINDINGS  The brainstem, cerebellum, cerebral peduncles, thalamus, basal ganglia, basilar cisterns, and ventricular system appear within normal limits. No intracranial hemorrhage, mass lesion, or acute CVA. Polypoid mucoperiosteal thickening noted in both maxillary sinuses. Nasal polyps are present and there is opacification of multiple ethmoid air  cells and mucoperiosteal thickening in multiple ethmoid air cells. Chronic right frontal sinusitis.  Patient appears to have multiple dental cavities including the left maxillary molars and the medial right maxillary premolar. Also the right maxillary canine appears to be only partially erupted.  CT CERVICAL SPINE FINDINGS  Anterior plate and screw fixator at C5-C6-C7 with solid interbody fusion an without significant osseous foraminal stenosis at these levels. Uncinate and facet spurring cause mild osseous foraminal narrowing on the left at C4-5.  The dorsal column stimulator leads observed at the C6 and C7 levels.  Degenerative spurring and the anterior C1-2 articulation. No vertebral subluxation is observed.  No cervical spine fracture observed.  IMPRESSION: 1. No acute intracranial or cervical spine findings. 2. Chronic paranasal sinusitis. 3. Multiple dental cavities. 4. Osseous foraminal stenosis on the left at C4-5. 5. Dorsal column stimulator leads posteriorly at C6-7.   Electronically Signed   By: Herbie BaltimoreWalt  Liebkemann M.D.   On: 08/27/2014 19:09   Ct Chest W Contrast  08/27/2014   CLINICAL DATA:  Fall from a tree stand.  Left leg pain.  EXAM: CT CHEST, ABDOMEN, AND PELVIS WITH CONTRAST  TECHNIQUE: Multidetector CT imaging  of the chest, abdomen and pelvis was performed following the standard protocol during bolus administration of intravenous contrast.  CONTRAST:  100mL OMNIPAQUE IOHEXOL 300 MG/ML  SOLN  COMPARISON:  Chest radiograph from 08/27/2014 ; prior chest CT dated 12/23/2007  FINDINGS: CT CHEST FINDINGS  No aortic dissection or mediastinal hematoma. No obvious signs of pulmonary embolus although today's exam was performed as a regular diagnostic CT and accordingly does not have high sensitivity for smaller emboli. Atherosclerotic aortic arch. Scattered small thoracic lymph nodes are not pathologically enlarged. The no pleural effusion.  Lingular bulla, similar to prior. No pneumothorax. Dependent  subsegmental atelectasis in both lower lobes. No pulmonary contusion.  Various left-sided rib deformities are new compared to 2/20 03/2008 but appear to likely be chronic/remote rib fractures. However, a left anterior seventh rib fracture has some immature/woven bone wound along its margins indicating a late subacute fracture. This is not thought to be from an immediately acute injury. Correlate with the patient's trauma history.  Deformity of the right scapula likewise appears old. Old right rib fractures noted.  Prior posterior decompression from T7 through T9. Vertebral hemangioma at T10 Schmorl's node along the superior endplate of T12. Abnormal anterior wedging of the T3 vertebra with about 45% loss of height, new compared to 12/23/2007, but probably old or late subacute given the overall appearance.  CT ABDOMEN AND PELVIS FINDINGS  Hepatobiliary: Unremarkable  Pancreas: Unremarkable  Spleen: 6 mm hypodense lesion in the dome of the spleen, image 37 series 2, not readily apparent on the prior CT scan from 12/23/2007.  Adrenals/Urinary Tract: 1.2 by 1.2 cm left adrenal nodule, washout characteristics typical for adenoma. Is 2.7 by 3.6 cm cyst of the left mid to lower kidney.  Stomach/Bowel: Scattered descending and sigmoid colon diverticula.  Vascular/Lymphatic: Aortoiliac atherosclerotic vascular disease.  Reproductive: Mild prominence of the prostate gland which measures 5.2 by 4.0 cm on image 112 of series 2. No prostate asymmetry.  Other: No supplemental non-categorized findings.  Musculoskeletal: Does bridging bone across the left posterior sacroiliac joint. Posterolateral rod and pedicle screw fixation at L4-L5-S1 with solid facet fusion and 1.3 cm anterolisthesis at L5-S1. Chronic appearing wedge compression fracture at L1 with 25% loss of height and about 3 mm of posterior bony retropulsion which appears chronic.  Umbilical hernia contains a 4.2 by 3.2 by 3.0 cm herniated segmental omental adipose tissue.   IMPRESSION: 1. Peripheral hypodense lesion in the dome of the spleen is rounded and not associated with perisplenic ascites. Accordingly I favor this is being a small benign splenic hypodense lesion rather than a grade 1 laceration. On the other hand, the lesion was not well visualized on the prior CT chest from 12/23/2007. Accordingly, there is a small chance that this could represent a tiny peripheral grade 1 splenic laceration. 2. Numerous rib fractures and several compression fractures in the spine seemed chronic or late subacute, and not acute in nature. Similarly the right scapular fracture looks old. 3. Small left adrenal adenoma. 4. Various incidental/cancel area findings, including atherosclerosis, mildly prominent prostate gland, mild sigmoid colon diverticulosis, postoperative findings in the thoracic and lumbar spine, and a moderate-sized umbilical hernia containing omental adipose tissue.   Electronically Signed   By: Herbie BaltimoreWalt  Liebkemann M.D.   On: 08/27/2014 20:54   Ct Cervical Spine Wo Contrast  08/27/2014   CLINICAL DATA:  Fall of a tree stand. Prior neck surgery 12 years ago.  EXAM: CT HEAD WITHOUT CONTRAST  CT CERVICAL SPINE WITHOUT CONTRAST  TECHNIQUE: Multidetector CT imaging of the head and cervical spine was performed following the standard protocol without intravenous contrast. Multiplanar CT image reconstructions of the cervical spine were also generated.  COMPARISON:  Multiple exams, including 12/23/2007 and 09/29/2007  FINDINGS: CT HEAD FINDINGS  The brainstem, cerebellum, cerebral peduncles, thalamus, basal ganglia, basilar cisterns, and ventricular system appear within normal limits. No intracranial hemorrhage, mass lesion, or acute CVA. Polypoid mucoperiosteal thickening noted in both maxillary sinuses. Nasal polyps are present and there is opacification of multiple ethmoid air cells and mucoperiosteal thickening in multiple ethmoid air cells. Chronic right frontal sinusitis.  Patient  appears to have multiple dental cavities including the left maxillary molars and the medial right maxillary premolar. Also the right maxillary canine appears to be only partially erupted.  CT CERVICAL SPINE FINDINGS  Anterior plate and screw fixator at C5-C6-C7 with solid interbody fusion an without significant osseous foraminal stenosis at these levels. Uncinate and facet spurring cause mild osseous foraminal narrowing on the left at C4-5.  The dorsal column stimulator leads observed at the C6 and C7 levels.  Degenerative spurring and the anterior C1-2 articulation. No vertebral subluxation is observed.  No cervical spine fracture observed.  IMPRESSION: 1. No acute intracranial or cervical spine findings. 2. Chronic paranasal sinusitis. 3. Multiple dental cavities. 4. Osseous foraminal stenosis on the left at C4-5. 5. Dorsal column stimulator leads posteriorly at C6-7.   Electronically Signed   By: Herbie Baltimore M.D.   On: 08/27/2014 19:09   Ct Abdomen Pelvis W Contrast  08/27/2014   CLINICAL DATA:  Fall from a tree stand.  Left leg pain.  EXAM: CT CHEST, ABDOMEN, AND PELVIS WITH CONTRAST  TECHNIQUE: Multidetector CT imaging of the chest, abdomen and pelvis was performed following the standard protocol during bolus administration of intravenous contrast.  CONTRAST:  OMNIPAQUE IOHEXOL 300 MG/ML  SOLN  COMPARISON:  Chest radiograph from 08/27/2014 ; prior chest CT dated 12/23/2007  FINDINGS: CT CHEST FINDINGS  No aortic dissection or mediastinal hematoma. No obvious signs of pulmonary embolus although today's exam was performed as a regular diagnostic CT and accordingly does not have high sensitivity for smaller emboli. Atherosclerotic aortic arch. Scattered small thoracic lymph nodes are not pathologically enlarged. The no pleural effusion.  Lingular bulla, similar to prior. No pneumothorax. Dependent subsegmental atelectasis in both lower lobes. No pulmonary contusion.  Various left-sided rib  deformities are new compared to 2/20 03/2008 but appear to likely be chronic/remote rib fractures. However, a left anterior seventh rib fracture has some immature/woven bone wound along its margins indicating a late subacute fracture. This is not thought to be from an immediately acute injury. Correlate with the patient's trauma history.  Deformity of the right scapula likewise appears old. Old right rib fractures noted.  Prior posterior decompression from T7 through T9. Vertebral hemangioma at T10 Schmorl's node along the superior endplate of T12. Abnormal anterior wedging of the T3 vertebra with about 45% loss of height, new compared to 12/23/2007, but probably old or late subacute given the overall appearance.  CT ABDOMEN AND PELVIS FINDINGS  Hepatobiliary: Unremarkable  Pancreas: Unremarkable  Spleen: 6 mm hypodense lesion in the dome of the spleen, image 37 series 2, not readily apparent on the prior CT scan from 12/23/2007.  Adrenals/Urinary Tract: 1.2 by 1.2 cm left adrenal nodule, washout characteristics typical for adenoma. Is 2.7 by 3.6 cm cyst of the left mid to lower kidney.  Stomach/Bowel: Scattered descending and sigmoid colon diverticula.  Vascular/Lymphatic: Aortoiliac atherosclerotic vascular disease.  Reproductive: Mild prominence of the prostate gland which measures 5.2 by 4.0 cm on image 112 of series 2. No prostate asymmetry.  Other: No supplemental non-categorized findings.  Musculoskeletal: Does bridging bone across the left posterior sacroiliac joint. Posterolateral rod and pedicle screw fixation at L4-L5-S1 with solid facet fusion and 1.3 cm anterolisthesis at L5-S1. Chronic appearing wedge compression fracture at L1 with 25% loss of height and about 3 mm of posterior bony retropulsion which appears chronic.  Umbilical hernia contains a 4.2 by 3.2 by 3.0 cm herniated segmental omental adipose tissue.  IMPRESSION: 1. Peripheral hypodense lesion in the dome of the spleen is rounded and not  associated with perisplenic ascites. Accordingly I favor this is being a small benign splenic hypodense lesion rather than a grade 1 laceration. On the other hand, the lesion was not well visualized on the prior CT chest from 12/23/2007. Accordingly, there is a small chance that this could represent a tiny peripheral grade 1 splenic laceration. 2. Numerous rib fractures and several compression fractures in the spine seemed chronic or late subacute, and not acute in nature. Similarly the right scapular fracture looks old. 3. Small left adrenal adenoma. 4. Various incidental/cancel area findings, including atherosclerosis, mildly prominent prostate gland, mild sigmoid colon diverticulosis, postoperative findings in the thoracic and lumbar spine, and a moderate-sized umbilical hernia containing omental adipose tissue.   Electronically Signed   By: Herbie Baltimore M.D.   On: 08/27/2014 20:54   Dg Pelvis Portable  08/27/2014   CLINICAL DATA:  Tree stand  EXAM: PORTABLE PELVIS 1-2 VIEWS  COMPARISON:  None.  FINDINGS: There is no evidence of pelvic fracture or dislocation. There is slight symmetric narrowing of both hip joints. There is postoperative change at L5 and S1.  IMPRESSION: Mild symmetric narrowing both hip joints. No apparent fracture or dislocation.   Electronically Signed   By: Bretta Bang M.D.   On: 08/27/2014 18:18   Dg Chest Port 1 View  08/27/2014   CLINICAL DATA:  Patient fell from tree stand.  Pain  EXAM: PORTABLE CHEST - 1 VIEW  COMPARISON:  Chest CT December 23, 2007  FINDINGS: There is no edema or consolidation. Heart is mildly enlarged with pulmonary vascularity within normal limits. No adenopathy. No pneumothorax. There are several apparent rib fractures on the right. There is a stimulator with the proximal aspect overlying the lower cervical region ; the actual tip is not seen. There is postoperative change in the lower cervical region.  IMPRESSION: Findings suspicious for several rib  fractures on the right. No pneumothorax. No edema or consolidation. Heart is mildly enlarged.   Electronically Signed   By: Bretta Bang M.D.   On: 08/27/2014 18:17    EKG Interpretation   Date/Time:  Sunday August 27 2014 17:07:49 EST Ventricular Rate:  71 PR Interval:  169 QRS Duration: 90 QT Interval:  388 QTC Calculation: 422 R Axis:   70 Text Interpretation:  Sinus rhythm Low voltage, precordial leads Baseline  wander in lead(s) V2 No significant change was found Confirmed by [facility]   MD, TREY ([id number]) on 08/27/2014 5:57:58 PM      MDM   Final diagnoses:  Fall  left closed tibia and fibula fractures  Level II trauma code after fall from height. Trauma most notable to left lower extremity. Neurovascularly intact, but appears to have unstable closed fracture.    CXR concerning for possible rib fractures.  CT ordered to further evaluate.  Trauma and Ortho consulted.  Required frequent doses of IV narcotic pain medication and frequent compartment checks.  Ortho to take to OR.    Warnell Forester, MD 08/28/14 0010

## 2014-08-27 NOTE — ED Notes (Signed)
Level 2 fall 12 feet from deer stand.  Alert oriented.  Pain abnd swelling to the lt lower tib  Fib   Good distal puklse.   Alert oriented skin warm and dry.   Iv per ems..  Abrasions to his lower extremities.  Pt received fentanyl   250mg  iv per gems.  On 02 for low sats  ?? Pain med caused

## 2014-08-27 NOTE — H&P (Addendum)
History   Joshua Fields is an 63 y.o. male.   Chief Complaint:  Chief Complaint  Patient presents with  . Fall    Fall  63 yo male fell about 12 feet out of a deer stand, landing on his left leg.  He reports bumping his head, but no LOC.  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  He was evaluated by ED as a level 2 trauma code and is found to have multiple left tib/fib fractures, but also some right rib fractures on CXR.  Chest and Abd/pelvis CT pending.  Ortho to operate on patient's leg tonight.   Past Medical History  Diagnosis Date  . Diabetes mellitus without complication   . Hypertension   . PE (pulmonary embolism)   . DVT (deep venous thrombosis)     History reviewed. No pertinent past surgical history.  No family history on file. Social History:  reports that he has never smoked. He does not have any smokeless tobacco history on file. He reports that he does not drink alcohol. His drug history is not on file.  Allergies  No Known Allergies  Home Medications        Prior to Admission medications   Medication Sig Start Date End Date Taking? Authorizing Provider  albuterol (PROVENTIL HFA;VENTOLIN HFA) 108 (90 BASE) MCG/ACT inhaler Inhale 1 puff into the lungs every 6 (six) hours as needed for wheezing or shortness of breath.   Yes Historical Provider, MD  Azelastine-Fluticasone (DYMISTA) 137-50 MCG/ACT SUSP Place 1 spray into the nose daily.   Yes Historical Provider, MD  ezetimibe-simvastatin (VYTORIN) 10-40 MG per tablet Take 1 tablet by mouth daily.    Yes Historical Provider, MD  glimepiride (AMARYL) 1 MG tablet Take 1 mg by mouth daily with breakfast.   Yes Historical Provider, MD  HYDROcodone-acetaminophen (NORCO) 10-325 MG per tablet Take 1 tablet by mouth every 6 (six) hours as needed for moderate pain.   Yes Historical Provider, MD  methocarbamol (ROBAXIN) 750 MG tablet Take 750 mg by mouth 4 (four) times daily.   Yes Historical Provider, MD  pregabalin  (LYRICA) 50 MG capsule Take 50 mg by mouth 3 (three) times daily.   Yes Historical Provider, MD  triamterene-hydrochlorothiazide (DYAZIDE) 37.5-25 MG per capsule Take 1 capsule by mouth daily.   Yes Historical Provider, MD  warfarin (COUMADIN) 4 MG tablet Take 4-8 mg by mouth daily at 6 PM. Takes 8 mg on Fri,Sat,Sun Takes 90m all other days   Yes Historical Provider, MD      Trauma Course   Results for orders placed or performed during the hospital encounter of 08/27/14 (from the past 48 hour(s))  CDS serology     Status: None   Collection Time: 08/27/14  4:18 PM  Result Value Ref Range   CDS serology specimen CDSCMT   Comprehensive metabolic panel     Status: Abnormal   Collection Time: 08/27/14  4:18 PM  Result Value Ref Range   Sodium 139 137 - 147 mEq/L   Potassium 4.4 3.7 - 5.3 mEq/L    Comment: HEMOLYSIS AT THIS LEVEL MAY AFFECT RESULT   Chloride 100 96 - 112 mEq/L   CO2 27 19 - 32 mEq/L   Glucose, Bld 108 (H) 70 - 99 mg/dL   BUN 18 6 - 23 mg/dL   Creatinine, Ser 0.78 0.50 - 1.35 mg/dL   Calcium 8.9 8.4 - 10.5 mg/dL   Total Protein 6.7 6.0 - 8.3  g/dL   Albumin 3.7 3.5 - 5.2 g/dL   AST 24 0 - 37 U/L    Comment: HEMOLYSIS AT THIS LEVEL MAY AFFECT RESULT   ALT 16 0 - 53 U/L    Comment: HEMOLYSIS AT THIS LEVEL MAY AFFECT RESULT   Alkaline Phosphatase 58 39 - 117 U/L   Total Bilirubin 0.4 0.3 - 1.2 mg/dL   GFR calc non Af Amer >90 >90 mL/min   GFR calc Af Amer >90 >90 mL/min    Comment: (NOTE) The eGFR has been calculated using the CKD EPI equation. This calculation has not been validated in all clinical situations. eGFR's persistently <90 mL/min signify possible Chronic Kidney Disease.    Anion gap 12 5 - 15  CBC     Status: Abnormal   Collection Time: 08/27/14  4:18 PM  Result Value Ref Range   WBC 8.5 4.0 - 10.5 K/uL   RBC 3.89 (L) 4.22 - 5.81 MIL/uL   Hemoglobin 12.7 (L) 13.0 - 17.0 g/dL   HCT 37.0 (L) 39.0 - 52.0 %   MCV 95.1 78.0 - 100.0 fL   MCH 32.6 26.0 -  34.0 pg   MCHC 34.3 30.0 - 36.0 g/dL   RDW 13.5 11.5 - 15.5 %   Platelets 181 150 - 400 K/uL  Ethanol     Status: None   Collection Time: 08/27/14  4:18 PM  Result Value Ref Range   Alcohol, Ethyl (B) <11 0 - 11 mg/dL    Comment:        LOWEST DETECTABLE LIMIT FOR SERUM ALCOHOL IS 11 mg/dL FOR MEDICAL PURPOSES ONLY   Protime-INR     Status: Abnormal   Collection Time: 08/27/14  4:18 PM  Result Value Ref Range   Prothrombin Time 21.9 (H) 11.6 - 15.2 seconds   INR 1.89 (H) 0.00 - 1.49  Sample to Blood Bank     Status: None   Collection Time: 08/27/14  5:23 PM  Result Value Ref Range   Blood Bank Specimen SAMPLE AVAILABLE FOR TESTING    Sample Expiration 08/28/2014    Dg Tibia/fibula Left  08/27/2014   CLINICAL DATA:  Fall from tree stand with leg pain  EXAM: LEFT TIBIA AND FIBULA - 2 VIEW  COMPARISON:  None.  FINDINGS: There are multiple fracture is identified. There is a transverse fracture through the mid the distal tibial diaphysis as well as a diffusely comminuted fracture of the distal fibular metaphysis. Additionally an undisplaced fracture is noted in the proximal tibia. Soft tissue swelling is noted. No disruption of the ankle joint is noted.  IMPRESSION: Multiple tibial and fibular fractures.   Electronically Signed   By: Inez Catalina M.D.   On: 08/27/2014 18:20   Ct Head Wo Contrast  08/27/2014   CLINICAL DATA:  Fall of a tree stand. Prior neck surgery 12 years ago.  EXAM: CT HEAD WITHOUT CONTRAST  CT CERVICAL SPINE WITHOUT CONTRAST  TECHNIQUE: Multidetector CT imaging of the head and cervical spine was performed following the standard protocol without intravenous contrast. Multiplanar CT image reconstructions of the cervical spine were also generated.  COMPARISON:  Multiple exams, including 12/23/2007 and 09/29/2007  FINDINGS: CT HEAD FINDINGS  The brainstem, cerebellum, cerebral peduncles, thalamus, basal ganglia, basilar cisterns, and ventricular system appear within normal  limits. No intracranial hemorrhage, mass lesion, or acute CVA. Polypoid mucoperiosteal thickening noted in both maxillary sinuses. Nasal polyps are present and there is opacification of multiple ethmoid air cells and mucoperiosteal  thickening in multiple ethmoid air cells. Chronic right frontal sinusitis.  Patient appears to have multiple dental cavities including the left maxillary molars and the medial right maxillary premolar. Also the right maxillary canine appears to be only partially erupted.  CT CERVICAL SPINE FINDINGS  Anterior plate and screw fixator at C5-C6-C7 with solid interbody fusion an without significant osseous foraminal stenosis at these levels. Uncinate and facet spurring cause mild osseous foraminal narrowing on the left at C4-5.  The dorsal column stimulator leads observed at the C6 and C7 levels.  Degenerative spurring and the anterior C1-2 articulation. No vertebral subluxation is observed.  No cervical spine fracture observed.  IMPRESSION: 1. No acute intracranial or cervical spine findings. 2. Chronic paranasal sinusitis. 3. Multiple dental cavities. 4. Osseous foraminal stenosis on the left at C4-5. 5. Dorsal column stimulator leads posteriorly at C6-7.   Electronically Signed   By: Sherryl Barters M.D.   On: 08/27/2014 19:09   Ct Cervical Spine Wo Contrast  08/27/2014   CLINICAL DATA:  Fall of a tree stand. Prior neck surgery 12 years ago.  EXAM: CT HEAD WITHOUT CONTRAST  CT CERVICAL SPINE WITHOUT CONTRAST  TECHNIQUE: Multidetector CT imaging of the head and cervical spine was performed following the standard protocol without intravenous contrast. Multiplanar CT image reconstructions of the cervical spine were also generated.  COMPARISON:  Multiple exams, including 12/23/2007 and 09/29/2007  FINDINGS: CT HEAD FINDINGS  The brainstem, cerebellum, cerebral peduncles, thalamus, basal ganglia, basilar cisterns, and ventricular system appear within normal limits. No intracranial  hemorrhage, mass lesion, or acute CVA. Polypoid mucoperiosteal thickening noted in both maxillary sinuses. Nasal polyps are present and there is opacification of multiple ethmoid air cells and mucoperiosteal thickening in multiple ethmoid air cells. Chronic right frontal sinusitis.  Patient appears to have multiple dental cavities including the left maxillary molars and the medial right maxillary premolar. Also the right maxillary canine appears to be only partially erupted.  CT CERVICAL SPINE FINDINGS  Anterior plate and screw fixator at C5-C6-C7 with solid interbody fusion an without significant osseous foraminal stenosis at these levels. Uncinate and facet spurring cause mild osseous foraminal narrowing on the left at C4-5.  The dorsal column stimulator leads observed at the C6 and C7 levels.  Degenerative spurring and the anterior C1-2 articulation. No vertebral subluxation is observed.  No cervical spine fracture observed.  IMPRESSION: 1. No acute intracranial or cervical spine findings. 2. Chronic paranasal sinusitis. 3. Multiple dental cavities. 4. Osseous foraminal stenosis on the left at C4-5. 5. Dorsal column stimulator leads posteriorly at C6-7.   Electronically Signed   By: Sherryl Barters M.D.   On: 08/27/2014 19:09   Dg Pelvis Portable  08/27/2014   CLINICAL DATA:  Tree stand  EXAM: PORTABLE PELVIS 1-2 VIEWS  COMPARISON:  None.  FINDINGS: There is no evidence of pelvic fracture or dislocation. There is slight symmetric narrowing of both hip joints. There is postoperative change at L5 and S1.  IMPRESSION: Mild symmetric narrowing both hip joints. No apparent fracture or dislocation.   Electronically Signed   By: Lowella Grip M.D.   On: 08/27/2014 18:18   Dg Chest Port 1 View  08/27/2014   CLINICAL DATA:  Patient fell from tree stand.  Pain  EXAM: PORTABLE CHEST - 1 VIEW  COMPARISON:  Chest CT December 23, 2007  FINDINGS: There is no edema or consolidation. Heart is mildly enlarged with  pulmonary vascularity within normal limits. No adenopathy. No pneumothorax. There are  several apparent rib fractures on the right. There is a stimulator with the proximal aspect overlying the lower cervical region ; the actual tip is not seen. There is postoperative change in the lower cervical region.  IMPRESSION: Findings suspicious for several rib fractures on the right. No pneumothorax. No edema or consolidation. Heart is mildly enlarged.   Electronically Signed   By: Lowella Grip M.D.   On: 08/27/2014 18:17   CLINICAL DATA: Fall from a tree stand. Left leg pain.  EXAM: CT CHEST, ABDOMEN, AND PELVIS WITH CONTRAST  TECHNIQUE: Multidetector CT imaging of the chest, abdomen and pelvis was performed following the standard protocol during bolus administration of intravenous contrast.  CONTRAST: 122m OMNIPAQUE IOHEXOL 300 MG/ML SOLN  COMPARISON: Chest radiograph from 08/27/2014 ; prior chest CT dated 12/23/2007  FINDINGS: CT CHEST FINDINGS  No aortic dissection or mediastinal hematoma. No obvious signs of pulmonary embolus although today's exam was performed as a regular diagnostic CT and accordingly does not have high sensitivity for smaller emboli. Atherosclerotic aortic arch. Scattered small thoracic lymph nodes are not pathologically enlarged. The no pleural effusion.  Lingular bulla, similar to prior. No pneumothorax. Dependent subsegmental atelectasis in both lower lobes. No pulmonary contusion.  Various left-sided rib deformities are new compared to 2/20 03/2008 but appear to likely be chronic/remote rib fractures. However, a left anterior seventh rib fracture has some immature/woven bone wound along its margins indicating a late subacute fracture. This is not thought to be from an immediately acute injury. Correlate with the patient's trauma history.  Deformity of the right scapula likewise appears old. Old right rib fractures noted.  Prior posterior  decompression from T7 through T9. 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  CT ABDOMEN AND PELVIS FINDINGS  Hepatobiliary: Unremarkable  Pancreas: Unremarkable  Spleen: 6 mm hypodense lesion in the dome of the spleen, image 37 series 2, not readily apparent on the prior CT scan from 12/23/2007.  Adrenals/Urinary Tract: 1.2 by 1.2 cm left adrenal nodule, washout characteristics typical for adenoma. Is 2.7 by 3.6 cm cyst of the left mid to lower kidney.  Stomach/Bowel: Scattered descending and sigmoid colon diverticula.  Vascular/Lymphatic: Aortoiliac atherosclerotic vascular disease.  Reproductive: Mild prominence of the prostate gland which measures 5.2 by 4.0 cm on image 112 of series 2. No prostate asymmetry.  Other: No supplemental non-categorized findings.  Musculoskeletal: Does bridging bone across the left posterior sacroiliac joint. Posterolateral rod and pedicle screw fixation at L4-L5-S1 with solid facet fusion and 1.3 cm anterolisthesis at L5-S1. Chronic appearing wedge compression fracture at L1 with 25% loss of height and about 3 mm of posterior bony retropulsion which appears chronic.  Umbilical hernia contains a 4.2 by 3.2 by 3.0 cm herniated segmental omental adipose tissue.  IMPRESSION: 1. Peripheral hypodense lesion in the dome of the spleen is rounded and not associated with perisplenic ascites. Accordingly I favor this is being a small benign splenic hypodense lesion rather than a grade 1 laceration. On the other hand, the lesion was not well visualized on the prior CT chest from 12/23/2007. Accordingly, there is a small chance that this could represent a tiny peripheral grade 1 splenic laceration. 2. Numerous rib fractures and several  compression fractures in the spine seemed chronic or late subacute, and not acute in nature. Similarly the  right scapular fracture looks old. 3. Small left adrenal adenoma. 4. Various incidental/cancel area findings, including atherosclerosis, mildly prominent prostate gland, mild sigmoid colon diverticulosis, postoperative findings in the thoracic and lumbar spine, and a moderate-sized umbilical hernia containing omental adipose tissue.   Electronically Signed  By: Sherryl Barters M.D.  On: 08/27/2014 20:54 ROS  Blood pressure 119/65, pulse 79, temperature 98.2 F (36.8 C), resp. rate 18, height 5' 7"  (1.702 m), weight 264 lb (119.75 kg), SpO2 99 %. Physical Exam   Due to a number of trauma patients and emergent general surgery patients, I was not able to examine the patient prior to the OR.  I reviewed his scans from another operating room, but he was examined by Ortho and the EDP.  Assessment/Plan Fall  Old rib/ scapula fractures L tib fib fractures.  OK for Ortho to proceed to OR. Ortho will call us if any questions.   Cleda Imel K. 08/27/2014, 8:06 PM   Procedures

## 2014-08-27 NOTE — Anesthesia Procedure Notes (Signed)
Procedure Name: Intubation Date/Time: 08/27/2014 9:55 PM Performed by: Molli HazardGORDON, [name] Pre-anesthesia Checklist: Patient identified, Emergency Drugs available, Suction available and Patient being monitored Patient Re-evaluated:Patient Re-evaluated prior to inductionOxygen Delivery Method: Circle system utilized Preoxygenation: Pre-oxygenation with 100% oxygen Intubation Type: IV induction, Rapid sequence and Cricoid Pressure applied Tube type: Oral Tube size: 7.5 mm Number of attempts: 1 Airway Equipment and Method: Video-laryngoscopy Placement Confirmation: ETT inserted through vocal cords under direct vision,  positive ETCO2 and breath sounds checked- equal and bilateral Secured at: 23 cm Tube secured with: Tape Dental Injury: Teeth and Oropharynx as per pre-operative assessment  Comments: Cervical collar remained on during induction and intubation.

## 2014-08-27 NOTE — ED Notes (Signed)
The pts pedal pulses remain strong  In the pts lt foot.  Cap refill under 3 secs.  Foot color good

## 2014-08-27 NOTE — Brief Op Note (Signed)
08/27/2014  11:25 PM  PATIENT:  Joshua Fields  63 y.o. male  PRE-OPERATIVE DIAGNOSIS:   1. Closed displaced left tibial shaft fracture      2.  Closed displaced comminuted left distal fibula fracture  POST-OPERATIVE DIAGNOSIS: Same  Procedure(s): 1.  Open treatment of left tibial shaft fracture with intramedullary nailing 2.  Closed treatment of left distal fibula fracture  SURGEON:  Toni ArthursJohn Joesiah Lonon, MD  ASSISTANT: n/a  ANESTHESIA:   General  EBL:  minimal   TOURNIQUET:  53 min at 250 mm Hg  COMPLICATIONS:  None apparent  DISPOSITION:  Extubated, awake and stable to recovery.  DICTATION ID:  [id number]

## 2014-08-27 NOTE — ED Notes (Signed)
dfr hewitt at the beside

## 2014-08-27 NOTE — ED Notes (Signed)
Pt still having pain.  Only a portion of the dilaudid  Infused tubing loose

## 2014-08-27 NOTE — ED Notes (Signed)
Port xrays complete.  Pt moved to anoither room.  c-collar replaced for comfort.  The pt ius still having pain.  Difficult to control the pain

## 2014-08-27 NOTE — ED Notes (Signed)
c-collar remains in place .  Pt is wanting it off and his pain has increased  Bp lower

## 2014-08-27 NOTE — ED Notes (Signed)
The pt returned from c-t.  Daughters at the bedsdie

## 2014-08-27 NOTE — Transfer of Care (Signed)
Immediate Anesthesia Transfer of Care Note  Patient: Joshua SledgeGary E Fields  Procedure(s) Performed: Procedure(s): INTRAMEDULLARY (IM) NAIL TIBIAL (Left)  Patient Location: PACU  Anesthesia Type:General  Level of Consciousness: awake, alert  and oriented  Airway & Oxygen Therapy: Patient connected to nasal cannula oxygen  Post-op Assessment: Report given to PACU RN, Post -op Vital signs reviewed and stable and Patient moving all extremities X 4  Post vital signs: Reviewed and stable  Complications: No apparent anesthesia complications

## 2014-08-27 NOTE — H&P (Signed)
Joshua Fields is an 63 y.o. male.   Chief Complaint:  Left leg pain HPI:  63 y/o male with PMH of PE and diabetes c/o left leg pain since falling from a tree stand earlier this afternoon.  He fell about 10 feet landing on the left leg only.  He denies any pain elsewhere - specifically his lower back and feet.  He denies any LOC.  He has no h/o injury to the L foot, ankle or leg.  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  INR today is 1.89.  He doesn't smoke but does dip.  Daughters are with him and are both MDs.  Dr. Georgette Dover has evaluated the patient and cleared him for surgery from a trauma perspective.  Past Medical History  Diagnosis Date  . Diabetes mellitus without complication   . Hypertension   . PE (pulmonary embolism)   . DVT (deep venous thrombosis)     History reviewed. No pertinent past surgical history.  FH:  noncontrib. Social History:  reports that he has never smoked. He does not have any smokeless tobacco history on file. He reports that he does not drink alcohol. His drug history is not on file.  Allergies: No Known Allergies   (Not in a hospital admission)  Results for orders placed or performed during the hospital encounter of 08/27/14 (from the past 48 hour(s))  CDS serology     Status: None   Collection Time: 08/27/14  4:18 PM  Result Value Ref Range   CDS serology specimen CDSCMT   Comprehensive metabolic panel     Status: Abnormal   Collection Time: 08/27/14  4:18 PM  Result Value Ref Range   Sodium 139 137 - 147 mEq/L   Potassium 4.4 3.7 - 5.3 mEq/L    Comment: HEMOLYSIS AT THIS LEVEL MAY AFFECT RESULT   Chloride 100 96 - 112 mEq/L   CO2 27 19 - 32 mEq/L   Glucose, Bld 108 (H) 70 - 99 mg/dL   BUN 18 6 - 23 mg/dL   Creatinine, Ser 0.78 0.50 - 1.35 mg/dL   Calcium 8.9 8.4 - 10.5 mg/dL   Total Protein 6.7 6.0 - 8.3 g/dL   Albumin 3.7 3.5 - 5.2 g/dL   AST 24 0 - 37 U/L    Comment: HEMOLYSIS AT THIS LEVEL MAY  AFFECT RESULT   ALT 16 0 - 53 U/L    Comment: HEMOLYSIS AT THIS LEVEL MAY AFFECT RESULT   Alkaline Phosphatase 58 39 - 117 U/L   Total Bilirubin 0.4 0.3 - 1.2 mg/dL   GFR calc non Af Amer >90 >90 mL/min   GFR calc Af Amer >90 >90 mL/min    Comment: (NOTE) The eGFR has been calculated using the CKD EPI equation. This calculation has not been validated in all clinical situations. eGFR's persistently <90 mL/min signify possible Chronic Kidney Disease.    Anion gap 12 5 - 15  CBC     Status: Abnormal   Collection Time: 08/27/14  4:18 PM  Result Value Ref Range   WBC 8.5 4.0 - 10.5 K/uL   RBC 3.89 (L) 4.22 - 5.81 MIL/uL   Hemoglobin 12.7 (L) 13.0 - 17.0 g/dL   HCT 37.0 (L) 39.0 - 52.0 %   MCV 95.1 78.0 - 100.0 fL   MCH 32.6 26.0 - 34.0 pg   MCHC 34.3 30.0 - 36.0 g/dL   RDW 13.5 11.5 -  15.5 %   Platelets 181 150 - 400 K/uL  Ethanol     Status: None   Collection Time: 08/27/14  4:18 PM  Result Value Ref Range   Alcohol, Ethyl (B) <11 0 - 11 mg/dL    Comment:        LOWEST DETECTABLE LIMIT FOR SERUM ALCOHOL IS 11 mg/dL FOR MEDICAL PURPOSES ONLY   Protime-INR     Status: Abnormal   Collection Time: 08/27/14  4:18 PM  Result Value Ref Range   Prothrombin Time 21.9 (H) 11.6 - 15.2 seconds   INR 1.89 (H) 0.00 - 1.49  Sample to Blood Bank     Status: None   Collection Time: 08/27/14  5:23 PM  Result Value Ref Range   Blood Bank Specimen SAMPLE AVAILABLE FOR TESTING    Sample Expiration 08/28/2014   Urinalysis, Routine w reflex microscopic     Status: None   Collection Time: 08/27/14  8:28 PM  Result Value Ref Range   Color, Urine YELLOW YELLOW   APPearance CLEAR CLEAR   Specific Gravity, Urine 1.023 1.005 - 1.030   pH 5.5 5.0 - 8.0   Glucose, UA NEGATIVE NEGATIVE mg/dL   Hgb urine dipstick NEGATIVE NEGATIVE   Bilirubin Urine NEGATIVE NEGATIVE   Ketones, ur NEGATIVE NEGATIVE mg/dL   Protein, ur NEGATIVE NEGATIVE mg/dL   Urobilinogen, UA 0.2 0.0 - 1.0 mg/dL   Nitrite  NEGATIVE NEGATIVE   Leukocytes, UA NEGATIVE NEGATIVE    Comment: MICROSCOPIC NOT DONE ON URINES WITH NEGATIVE PROTEIN, BLOOD, LEUKOCYTES, NITRITE, OR GLUCOSE <1000 mg/dL.   Dg Tibia/fibula Left  08/27/2014   CLINICAL DATA:  Fall from tree stand with leg pain  EXAM: LEFT TIBIA AND FIBULA - 2 VIEW  COMPARISON:  None.  FINDINGS: There are multiple fracture is identified. There is a transverse fracture through the mid the distal tibial diaphysis as well as a diffusely comminuted fracture of the distal fibular metaphysis. Additionally an undisplaced fracture is noted in the proximal tibia. Soft tissue swelling is noted. No disruption of the ankle joint is noted.  IMPRESSION: Multiple tibial and fibular fractures.   Electronically Signed   By: Inez Catalina M.D.   On: 08/27/2014 18:20   Ct Head Wo Contrast  08/27/2014   CLINICAL DATA:  Fall of a tree stand. Prior neck surgery 12 years ago.  EXAM: CT HEAD WITHOUT CONTRAST  CT CERVICAL SPINE WITHOUT CONTRAST  TECHNIQUE: Multidetector CT imaging of the head and cervical spine was performed following the standard protocol without intravenous contrast. Multiplanar CT image reconstructions of the cervical spine were also generated.  COMPARISON:  Multiple exams, including 12/23/2007 and 09/29/2007  FINDINGS: CT HEAD FINDINGS  The brainstem, cerebellum, cerebral peduncles, thalamus, basal ganglia, basilar cisterns, and ventricular system appear within normal limits. No intracranial hemorrhage, mass lesion, or acute CVA. Polypoid mucoperiosteal thickening noted in both maxillary sinuses. Nasal polyps are present and there is opacification of multiple ethmoid air cells and mucoperiosteal thickening in multiple ethmoid air cells. Chronic right frontal sinusitis.  Patient appears to have multiple dental cavities including the left maxillary molars and the medial right maxillary premolar. Also the right maxillary canine appears to be only partially erupted.  CT CERVICAL SPINE  FINDINGS  Anterior plate and screw fixator at C5-C6-C7 with solid interbody fusion an without significant osseous foraminal stenosis at these levels. Uncinate and facet spurring cause mild osseous foraminal narrowing on the left at C4-5.  The dorsal column stimulator leads observed at  the C6 and C7 levels.  Degenerative spurring and the anterior C1-2 articulation. No vertebral subluxation is observed.  No cervical spine fracture observed.  IMPRESSION: 1. No acute intracranial or cervical spine findings. 2. Chronic paranasal sinusitis. 3. Multiple dental cavities. 4. Osseous foraminal stenosis on the left at C4-5. 5. Dorsal column stimulator leads posteriorly at C6-7.   Electronically Signed   By: Sherryl Barters M.D.   On: 08/27/2014 19:09   Ct Chest W Contrast  08/27/2014   CLINICAL DATA:  Fall from a tree stand.  Left leg pain.  EXAM: CT CHEST, ABDOMEN, AND PELVIS WITH CONTRAST  TECHNIQUE: Multidetector CT imaging of the chest, abdomen and pelvis was performed following the standard protocol during bolus administration of intravenous contrast.  CONTRAST:  160m OMNIPAQUE IOHEXOL 300 MG/ML  SOLN  COMPARISON:  Chest radiograph from 08/27/2014 ; prior chest CT dated 12/23/2007  FINDINGS: CT CHEST FINDINGS  No aortic dissection or mediastinal hematoma. No obvious signs of pulmonary embolus although today's exam was performed as a regular diagnostic CT and accordingly does not have high sensitivity for smaller emboli. Atherosclerotic aortic arch. Scattered small thoracic lymph nodes are not pathologically enlarged. The no pleural effusion.  Lingular bulla, similar to prior. No pneumothorax. Dependent subsegmental atelectasis in both lower lobes. No pulmonary contusion.  Various left-sided rib deformities are new compared to 2/20 03/2008 but appear to likely be chronic/remote rib fractures. However, a left anterior seventh rib fracture has some immature/woven bone wound along its margins indicating a late subacute  fracture. This is not thought to be from an immediately acute injury. Correlate with the patient's trauma history.  Deformity of the right scapula likewise appears old. Old right rib fractures noted.  Prior posterior decompression from T7 through T9. Vertebral hemangioma at T10 Schmorl's node along the superior endplate of TH47 Abnormal anterior wedging of the T3 vertebra with about 45% loss of height, new compared to 12/23/2007, but probably old or late subacute given the overall appearance.  CT ABDOMEN AND PELVIS FINDINGS  Hepatobiliary: Unremarkable  Pancreas: Unremarkable  Spleen: 6 mm hypodense lesion in the dome of the spleen, image 37 series 2, not readily apparent on the prior CT scan from 12/23/2007.  Adrenals/Urinary Tract: 1.2 by 1.2 cm left adrenal nodule, washout characteristics typical for adenoma. Is 2.7 by 3.6 cm cyst of the left mid to lower kidney.  Stomach/Bowel: Scattered descending and sigmoid colon diverticula.  Vascular/Lymphatic: Aortoiliac atherosclerotic vascular disease.  Reproductive: Mild prominence of the prostate gland which measures 5.2 by 4.0 cm on image 112 of series 2. No prostate asymmetry.  Other: No supplemental non-categorized findings.  Musculoskeletal: Does bridging bone across the left posterior sacroiliac joint. Posterolateral rod and pedicle screw fixation at L4-L5-S1 with solid facet fusion and 1.3 cm anterolisthesis at L5-S1. Chronic appearing wedge compression fracture at L1 with 25% loss of height and about 3 mm of posterior bony retropulsion which appears chronic.  Umbilical hernia contains a 4.2 by 3.2 by 3.0 cm herniated segmental omental adipose tissue.  IMPRESSION: 1. Peripheral hypodense lesion in the dome of the spleen is rounded and not associated with perisplenic ascites. Accordingly I favor this is being a small benign splenic hypodense lesion rather than a grade 1 laceration. On the other hand, the lesion was not well visualized on the prior CT chest from  12/23/2007. Accordingly, there is a small chance that this could represent a tiny peripheral grade 1 splenic laceration. 2. Numerous rib fractures and several compression fractures  in the spine seemed chronic or late subacute, and not acute in nature. Similarly the right scapular fracture looks old. 3. Small left adrenal adenoma. 4. Various incidental/cancel area findings, including atherosclerosis, mildly prominent prostate gland, mild sigmoid colon diverticulosis, postoperative findings in the thoracic and lumbar spine, and a moderate-sized umbilical hernia containing omental adipose tissue.   Electronically Signed   By: Sherryl Barters M.D.   On: 08/27/2014 20:54   Ct Cervical Spine Wo Contrast  08/27/2014   CLINICAL DATA:  Fall of a tree stand. Prior neck surgery 12 years ago.  EXAM: CT HEAD WITHOUT CONTRAST  CT CERVICAL SPINE WITHOUT CONTRAST  TECHNIQUE: Multidetector CT imaging of the head and cervical spine was performed following the standard protocol without intravenous contrast. Multiplanar CT image reconstructions of the cervical spine were also generated.  COMPARISON:  Multiple exams, including 12/23/2007 and 09/29/2007  FINDINGS: CT HEAD FINDINGS  The brainstem, cerebellum, cerebral peduncles, thalamus, basal ganglia, basilar cisterns, and ventricular system appear within normal limits. No intracranial hemorrhage, mass lesion, or acute CVA. Polypoid mucoperiosteal thickening noted in both maxillary sinuses. Nasal polyps are present and there is opacification of multiple ethmoid air cells and mucoperiosteal thickening in multiple ethmoid air cells. Chronic right frontal sinusitis.  Patient appears to have multiple dental cavities including the left maxillary molars and the medial right maxillary premolar. Also the right maxillary canine appears to be only partially erupted.  CT CERVICAL SPINE FINDINGS  Anterior plate and screw fixator at C5-C6-C7 with solid interbody fusion an without significant  osseous foraminal stenosis at these levels. Uncinate and facet spurring cause mild osseous foraminal narrowing on the left at C4-5.  The dorsal column stimulator leads observed at the C6 and C7 levels.  Degenerative spurring and the anterior C1-2 articulation. No vertebral subluxation is observed.  No cervical spine fracture observed.  IMPRESSION: 1. No acute intracranial or cervical spine findings. 2. Chronic paranasal sinusitis. 3. Multiple dental cavities. 4. Osseous foraminal stenosis on the left at C4-5. 5. Dorsal column stimulator leads posteriorly at C6-7.   Electronically Signed   By: Sherryl Barters M.D.   On: 08/27/2014 19:09   Ct Abdomen Pelvis W Contrast  08/27/2014   CLINICAL DATA:  Fall from a tree stand.  Left leg pain.  EXAM: CT CHEST, ABDOMEN, AND PELVIS WITH CONTRAST  TECHNIQUE: Multidetector CT imaging of the chest, abdomen and pelvis was performed following the standard protocol during bolus administration of intravenous contrast.  CONTRAST:  168m OMNIPAQUE IOHEXOL 300 MG/ML  SOLN  COMPARISON:  Chest radiograph from 08/27/2014 ; prior chest CT dated 12/23/2007  FINDINGS: CT CHEST FINDINGS  No aortic dissection or mediastinal hematoma. No obvious signs of pulmonary embolus although today's exam was performed as a regular diagnostic CT and accordingly does not have high sensitivity for smaller emboli. Atherosclerotic aortic arch. Scattered small thoracic lymph nodes are not pathologically enlarged. The no pleural effusion.  Lingular bulla, similar to prior. No pneumothorax. Dependent subsegmental atelectasis in both lower lobes. No pulmonary contusion.  Various left-sided rib deformities are new compared to 2/20 03/2008 but appear to likely be chronic/remote rib fractures. However, a left anterior seventh rib fracture has some immature/woven bone wound along its margins indicating a late subacute fracture. This is not thought to be from an immediately acute injury. Correlate with the patient's  trauma history.  Deformity of the right scapula likewise appears old. Old right rib fractures noted.  Prior posterior decompression from T7 through T9. Vertebral hemangioma at  T10 Schmorl's node along the superior endplate of K93. Abnormal anterior wedging of the T3 vertebra with about 45% loss of height, new compared to 12/23/2007, but probably old or late subacute given the overall appearance.  CT ABDOMEN AND PELVIS FINDINGS  Hepatobiliary: Unremarkable  Pancreas: Unremarkable  Spleen: 6 mm hypodense lesion in the dome of the spleen, image 37 series 2, not readily apparent on the prior CT scan from 12/23/2007.  Adrenals/Urinary Tract: 1.2 by 1.2 cm left adrenal nodule, washout characteristics typical for adenoma. Is 2.7 by 3.6 cm cyst of the left mid to lower kidney.  Stomach/Bowel: Scattered descending and sigmoid colon diverticula.  Vascular/Lymphatic: Aortoiliac atherosclerotic vascular disease.  Reproductive: Mild prominence of the prostate gland which measures 5.2 by 4.0 cm on image 112 of series 2. No prostate asymmetry.  Other: No supplemental non-categorized findings.  Musculoskeletal: Does bridging bone across the left posterior sacroiliac joint. Posterolateral rod and pedicle screw fixation at L4-L5-S1 with solid facet fusion and 1.3 cm anterolisthesis at L5-S1. Chronic appearing wedge compression fracture at L1 with 25% loss of height and about 3 mm of posterior bony retropulsion which appears chronic.  Umbilical hernia contains a 4.2 by 3.2 by 3.0 cm herniated segmental omental adipose tissue.  IMPRESSION: 1. Peripheral hypodense lesion in the dome of the spleen is rounded and not associated with perisplenic ascites. Accordingly I favor this is being a small benign splenic hypodense lesion rather than a grade 1 laceration. On the other hand, the lesion was not well visualized on the prior CT chest from 12/23/2007. Accordingly, there is a small chance that this could represent a tiny peripheral grade 1  splenic laceration. 2. Numerous rib fractures and several compression fractures in the spine seemed chronic or late subacute, and not acute in nature. Similarly the right scapular fracture looks old. 3. Small left adrenal adenoma. 4. Various incidental/cancel area findings, including atherosclerosis, mildly prominent prostate gland, mild sigmoid colon diverticulosis, postoperative findings in the thoracic and lumbar spine, and a moderate-sized umbilical hernia containing omental adipose tissue.   Electronically Signed   By: Sherryl Barters M.D.   On: 08/27/2014 20:54   Dg Pelvis Portable  08/27/2014   CLINICAL DATA:  Tree stand  EXAM: PORTABLE PELVIS 1-2 VIEWS  COMPARISON:  None.  FINDINGS: There is no evidence of pelvic fracture or dislocation. There is slight symmetric narrowing of both hip joints. There is postoperative change at L5 and S1.  IMPRESSION: Mild symmetric narrowing both hip joints. No apparent fracture or dislocation.   Electronically Signed   By: Lowella Grip M.D.   On: 08/27/2014 18:18   Dg Chest Port 1 View  08/27/2014   CLINICAL DATA:  Patient fell from tree stand.  Pain  EXAM: PORTABLE CHEST - 1 VIEW  COMPARISON:  Chest CT December 23, 2007  FINDINGS: There is no edema or consolidation. Heart is mildly enlarged with pulmonary vascularity within normal limits. No adenopathy. No pneumothorax. There are several apparent rib fractures on the right. There is a stimulator with the proximal aspect overlying the lower cervical region ; the actual tip is not seen. There is postoperative change in the lower cervical region.  IMPRESSION: Findings suspicious for several rib fractures on the right. No pneumothorax. No edema or consolidation. Heart is mildly enlarged.   Electronically Signed   By: Lowella Grip M.D.   On: 08/27/2014 18:17    ROS  No recent f/c/n/v/wt loss.  Blood pressure 103/50, pulse 73, temperature 98 F (36.7  C), resp. rate 18, height 5' 7"  (1.702 m), weight 119.75  kg (264 lb), SpO2 94 %. Physical Exam  wn wd male in nad.  A and O x 4.  Mood and affect normal.  EOMI.  resp unlabored.  L LE with mild swelling but no gross deformity.  TTP at anterior leg, but no tenderness at the calf.  Compartments are soft and non-tender.  Sens to LT intact at the dorsal and plantar foot.  2+ dp and 1+ pt pulses.  5/5 strength in PF and DF of the toes.  No excessive pain with passive stretch of the anterior and posterior compartments.  Assessment/Plan L segmental tibia and distal fibula fractures - to OR for IM nailing of the tibia fracture.  The risks and benefits of the alternative treatment options have been discussed in detail.  The patient wishes to proceed with surgery and specifically understands risks of bleeding, infection, nerve damage, blood clots, need for additional surgery, amputation and death.   Joshua Fields 2014/09/05, 9:30 PM

## 2014-08-27 NOTE — ED Notes (Signed)
The pt just returned from c-t or permit signed.  Waiting on the radiologidt to read the pts  scans

## 2014-08-27 NOTE — ED Notes (Signed)
Pain med given 

## 2014-08-27 NOTE — Progress Notes (Signed)
Chaplain responded to trauma pager. I met daughter at bedside.  Offered support and nutrition.  Patient appears to be in extreme pain.  Daughter appreciated offer but declined support at this time. Please call as needed.    Dorathy D[place], [place]

## 2014-08-27 NOTE — ED Notes (Signed)
Pt c/o severe pain in his leg  Unable to get the pt comfortable

## 2014-08-27 NOTE — ED Notes (Signed)
Daughter at the bedside.  She was at the deer stand with the pt when the cable   broke

## 2014-08-27 NOTE — ED Notes (Signed)
The pt has just gone to c-t.  Ice packs  To both lower extremites

## 2014-08-27 NOTE — ED Notes (Signed)
The pt just went to ct.  The or has been contacted and they will be ready for him after his c-t has been fiknished

## 2014-08-28 ENCOUNTER — Encounter (HOSPITAL_COMMUNITY): Payer: Self-pay | Admitting: General Practice

## 2014-08-28 DIAGNOSIS — M961 Postlaminectomy syndrome, not elsewhere classified: Secondary | ICD-10-CM

## 2014-08-28 DIAGNOSIS — S2249XD Multiple fractures of ribs, unspecified side, subsequent encounter for fracture with routine healing: Secondary | ICD-10-CM

## 2014-08-28 DIAGNOSIS — S82202S Unspecified fracture of shaft of left tibia, sequela: Secondary | ICD-10-CM

## 2014-08-28 LAB — CBC
HCT: 35.3 % — ABNORMAL LOW (ref 39.0–52.0)
Hemoglobin: 11.8 g/dL — ABNORMAL LOW (ref 13.0–17.0)
MCH: 32.2 pg (ref 26.0–34.0)
MCHC: 33.4 g/dL (ref 30.0–36.0)
MCV: 96.2 fL (ref 78.0–100.0)
PLATELETS: 165 10*3/uL (ref 150–400)
RBC: 3.67 MIL/uL — ABNORMAL LOW (ref 4.22–5.81)
RDW: 13.7 % (ref 11.5–15.5)
WBC: 7 10*3/uL (ref 4.0–10.5)

## 2014-08-28 LAB — GLUCOSE, CAPILLARY
GLUCOSE-CAPILLARY: 162 mg/dL — AB (ref 70–99)
GLUCOSE-CAPILLARY: 126 mg/dL — AB (ref 70–99)
GLUCOSE-CAPILLARY: 111 mg/dL — AB (ref 70–99)
Glucose-Capillary: 110 mg/dL — ABNORMAL HIGH (ref 70–99)
Glucose-Capillary: 118 mg/dL — ABNORMAL HIGH (ref 70–99)
Glucose-Capillary: 106 mg/dL — ABNORMAL HIGH (ref 70–99)

## 2014-08-28 LAB — PROTIME-INR
INR: 1.91 — ABNORMAL HIGH (ref 0.00–1.49)
Prothrombin Time: 22.1 seconds — ABNORMAL HIGH (ref 11.6–15.2)

## 2014-08-28 MED ORDER — METOCLOPRAMIDE HCL 5 MG/ML IJ SOLN: 5.0000 mg | Freq: Three times a day (TID) | INTRAMUSCULAR | Status: DC | PRN

## 2014-08-28 MED ORDER — GLIMEPIRIDE 1 MG PO TABS
1.0000 mg | ORAL_TABLET | Freq: Every day | ORAL | Status: DC
  Administered 2014-08-28 – 2014-08-30 (×4): 1 mg via ORAL
  Filled 2014-08-28 (×4): qty 1

## 2014-08-28 MED ORDER — OXYCODONE HCL 5 MG PO TABS
5.0000 mg | ORAL_TABLET | ORAL | Status: DC | PRN
  Administered 2014-08-28 (×3): 10 mg via ORAL
  Filled 2014-08-28 (×4): qty 2

## 2014-08-28 MED ORDER — FLUTICASONE PROPIONATE 50 MCG/ACT NA SUSP
1.0000 | Freq: Every day | NASAL | Status: DC
  Administered 2014-08-28 – 2014-08-29 (×2): 1 via NASAL
  Filled 2014-08-28: qty 16

## 2014-08-28 MED ORDER — HYDROMORPHONE HCL 1 MG/ML IJ SOLN
0.5000 mg | INTRAMUSCULAR | Status: DC | PRN
  Administered 2014-08-28 – 2014-08-30 (×12): 1 mg via INTRAVENOUS
  Filled 2014-08-28 (×12): qty 1

## 2014-08-28 MED ORDER — ONDANSETRON HCL 4 MG PO TABS: 4.0000 mg | ORAL_TABLET | Freq: Four times a day (QID) | ORAL | Status: DC | PRN

## 2014-08-28 MED ORDER — SENNA 8.6 MG PO TABS
2.0000 | ORAL_TABLET | Freq: Two times a day (BID) | ORAL | Status: DC
  Administered 2014-08-28 – 2014-08-30 (×5): 17.2 mg via ORAL
  Filled 2014-08-28 (×6): qty 2

## 2014-08-28 MED ORDER — PREGABALIN 50 MG PO CAPS
50.0000 mg | ORAL_CAPSULE | Freq: Three times a day (TID) | ORAL | Status: DC
  Administered 2014-08-28 – 2014-08-30 (×8): 50 mg via ORAL
  Filled 2014-08-28 (×8): qty 1

## 2014-08-28 MED ORDER — INFLUENZA VAC SPLIT QUAD 0.5 ML IM SUSY
0.5000 mL | PREFILLED_SYRINGE | INTRAMUSCULAR | Status: AC
  Administered 2014-08-30: 0.5 mL via INTRAMUSCULAR
  Filled 2014-08-28: qty 0.5

## 2014-08-28 MED ORDER — AZELASTINE-FLUTICASONE 137-50 MCG/ACT NA SUSP: 1.0000 | Freq: Every day | NASAL | Status: DC

## 2014-08-28 MED ORDER — AZELASTINE HCL 0.1 % NA SOLN
1.0000 | Freq: Every day | NASAL | Status: DC
  Administered 2014-08-28: 1 via NASAL
  Filled 2014-08-28: qty 30

## 2014-08-28 MED ORDER — INSULIN ASPART 100 UNIT/ML ~~LOC~~ SOLN
0.0000 [IU] | Freq: Three times a day (TID) | SUBCUTANEOUS | Status: DC
  Administered 2014-08-30: 5 [IU] via SUBCUTANEOUS
  Administered 2014-08-30: 2 [IU] via SUBCUTANEOUS

## 2014-08-28 MED ORDER — ONDANSETRON HCL 4 MG/2ML IJ SOLN
4.0000 mg | Freq: Four times a day (QID) | INTRAMUSCULAR | Status: DC | PRN
  Administered 2014-08-28 – 2014-08-30 (×4): 4 mg via INTRAVENOUS
  Filled 2014-08-28 (×4): qty 2

## 2014-08-28 MED ORDER — METOCLOPRAMIDE HCL 10 MG PO TABS: 5.0000 mg | ORAL_TABLET | Freq: Three times a day (TID) | ORAL | Status: DC | PRN

## 2014-08-28 MED ORDER — TRIAMTERENE-HCTZ 37.5-25 MG PO TABS
1.0000 | ORAL_TABLET | Freq: Every day | ORAL | Status: DC
  Administered 2014-08-29 – 2014-08-30 (×2): 1 via ORAL
  Filled 2014-08-28 (×3): qty 1

## 2014-08-28 MED ORDER — DOCUSATE SODIUM 100 MG PO CAPS
100.0000 mg | ORAL_CAPSULE | Freq: Two times a day (BID) | ORAL | Status: DC
  Administered 2014-08-28 – 2014-08-30 (×5): 100 mg via ORAL
  Filled 2014-08-28 (×7): qty 1

## 2014-08-28 MED ORDER — HYDROMORPHONE HCL 1 MG/ML IJ SOLN
INTRAMUSCULAR | Status: AC
Start: 2014-08-28 — End: 2014-08-28
  Filled 2014-08-28: qty 1

## 2014-08-28 MED ORDER — EZETIMIBE-SIMVASTATIN 10-40 MG PO TABS
1.0000 | ORAL_TABLET | Freq: Every day | ORAL | Status: DC
  Administered 2014-08-28 – 2014-08-30 (×3): 1 via ORAL
  Filled 2014-08-28 (×3): qty 1

## 2014-08-28 MED ORDER — ACETAMINOPHEN 325 MG PO TABS
650.0000 mg | ORAL_TABLET | Freq: Four times a day (QID) | ORAL | Status: DC | PRN
  Administered 2014-08-28 – 2014-08-30 (×2): 650 mg via ORAL
  Filled 2014-08-28 (×3): qty 2

## 2014-08-28 MED ORDER — PNEUMOCOCCAL VAC POLYVALENT 25 MCG/0.5ML IJ INJ
0.5000 mL | INJECTION | INTRAMUSCULAR | Status: AC
  Administered 2014-08-30: 0.5 mL via INTRAMUSCULAR
  Filled 2014-08-28 (×2): qty 0.5

## 2014-08-28 MED ORDER — OXYCODONE HCL 5 MG PO TABS
15.0000 mg | ORAL_TABLET | ORAL | Status: DC | PRN
  Administered 2014-08-28 – 2014-08-30 (×13): 15 mg via ORAL
  Filled 2014-08-28 (×14): qty 3

## 2014-08-28 MED ORDER — WARFARIN SODIUM 7.5 MG PO TABS
7.5000 mg | ORAL_TABLET | Freq: Once | ORAL | Status: AC
  Administered 2014-08-28: 7.5 mg via ORAL
  Filled 2014-08-28: qty 1

## 2014-08-28 MED ORDER — WARFARIN - PHARMACIST DOSING INPATIENT: Freq: Every day | Status: DC

## 2014-08-28 MED ORDER — HYDROMORPHONE HCL 1 MG/ML IJ SOLN
0.5000 mg | INTRAMUSCULAR | Status: DC | PRN
  Administered 2014-08-28 (×3): 1 mg via INTRAVENOUS
  Filled 2014-08-28 (×5): qty 1

## 2014-08-28 NOTE — Plan of Care (Signed)
Problem: Phase I Progression Outcomes Goal: Post op CMS Neurovascular WDL Outcome: Completed/Met Date Met:  08/28/14     

## 2014-08-28 NOTE — Evaluation (Signed)
Physical Therapy Evaluation Patient Details Name: Joshua SledgeGary E Dubois MRN: [id number] DOB: 04/24/1951 Today's Date: 08/28/2014   History of Present Illness  63 y/o male with PMH of PE and diabetes c/o left leg pain since falling from a tree stand earlier this afternoon.  He fell about 10 feet landing on the left leg only.  He denies any pain elsewhere - specifically his lower back and feet.  He denies any LOC.  Closed displaced left tibial shaft fracture and Closed displaced comminuted left distal fibula fracture . Pt is s/p Open treatment of left tibial shaft fracture with intramedullary nailing and Closed treatment of left distal fibula fracture.  Clinical Impression  Pt admitted with L tib/fib fractures with IM nailing of tibial shaft fx.. Pt currently with functional limitations due to the deficits listed below (see PT Problem List).  Pt will benefit from skilled PT to increase their independence and safety with mobility to allow discharge to the venue listed below. 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  Daughter reports that he had falls at home due to R LE weakness and that his L LE was his stronger one. Recommend CIR if pt qualifies.  Otherwise family would like to take him home if he is at a level where they can care for pt.  Will continue to assess progress.  Also, the CAM boot at the eval was too small, so maintained NWB.  This therapist feels that even if the CAM boot had fit, the patient would have been able to put little to no weight through LE anyway due to 10/10 pain.      Follow Up Recommendations CIR    Equipment Recommendations  3in1 (PT)    Recommendations for Other Services Rehab consult     Precautions / Restrictions Precautions Precautions: Fall Restrictions Weight Bearing Restrictions: Yes LLE Weight Bearing: Weight bearing as tolerated Other Position/Activity Restrictions: with CAM boot (per OP REPORT)      Mobility  Bed  Mobility Overal bed mobility: Needs Assistance;+2 for physical assistance Bed Mobility: Supine to Sit     Supine to sit: +2 for physical assistance;Min assist     General bed mobility comments: Pt allowed daughter to A getting leg to side of bed by holding big toe and moving it that way. He need help getting trunk upright.  Transfers Overall transfer level: Needs assistance Equipment used: Rolling walker (2 wheeled) Transfers: Sit to/from [employer] CorporationStand;Stand Pivot Transfers Sit to Stand: Mod assist;+2 physical assistance Stand pivot transfers: Total assist       General transfer comment: CAM boot in room too small for patient and requested an XL. Kept pt as NWB L LE during treatment.  He was able to stand, but could not shimmy or hop on R foot.  Bed was moved and recliner chair brought behind patient.  Ambulation/Gait                Stairs            Wheelchair Mobility    Modified Rankin (Stroke Patients Only)       Balance Overall balance assessment: Needs assistance Sitting-balance support: Bilateral upper extremity supported Sitting balance-Leahy Scale: Fair     Standing balance support: Bilateral upper extremity supported Standing balance-Leahy Scale: Poor                               Pertinent  Vitals/Pain Pain Assessment: 0-10 Pain Score: 10-Worst pain ever Pain Location: L LE Pain Descriptors / Indicators: Burning;Throbbing Pain Intervention(s): Monitored during session;Premedicated before session    Home Living Family/patient expects to be discharged to:: Private residence Living Arrangements: Spouse/significant other;Children Available Help at Discharge: Available 24 hours/day;Family Type of Home: House Home Access: Stairs to enter Entrance Stairs-Rails: Right Entrance Stairs-Number of Steps: 2 Home Layout: One level;Laundry or work area in Pitney Bowesbasement Home Equipment: Gilmer MorCane - single point;Walker - 2 wheels      Prior Function Level  of Independence: Independent with assistive device(s)         Comments: Daughter reports pt with frequent falls due to R LE weakness.       Hand Dominance        Extremity/Trunk Assessment               Lower Extremity Assessment: LLE deficits/detail;Generalized weakness      Cervical / Trunk Assessment: Normal  Communication      Cognition Arousal/Alertness: Awake/alert;Lethargic;Suspect due to medications Behavior [facility]: [facility] for tasks assessed/performed Overall Cognitive Status: Within Functional Limits for tasks assessed                      General Comments General comments (skin integrity, edema, etc.): Supportive daughter.    Exercises        Assessment/Plan    PT Assessment Patient needs continued PT services  PT Diagnosis Acute pain;Difficulty walking   PT Problem List Decreased activity tolerance;Decreased balance;Decreased mobility;Decreased knowledge of use of DME  PT Treatment Interventions Gait training;Stair training;Functional mobility training;Therapeutic activities;Therapeutic exercise;DME instruction;Balance training   PT Goals (Current goals can be found in the Care Plan section) Acute Rehab PT Goals Patient Stated Goal: To move better PT Goal Formulation: With patient/family Time For Goal Achievement: 09/11/14 Potential to Achieve Goals: Good    Frequency Min 5X/week   Barriers to discharge        Co-evaluation               End of Session Equipment Utilized During Treatment: Gait belt Activity Tolerance: Patient limited by pain Patient left: in chair;with family/visitor present;with call bell/phone within reach Nurse Communication: Mobility status;Weight bearing status;Other (comment) (need for XL CAM boot)         Time: 1020-1050 PT Time Calculation (min): 30 min   Charges:   PT Evaluation $Initial PT Evaluation Tier I: 1 Procedure PT Treatments $Therapeutic Activity: 8-22 mins   PT G Codes:           Langworthy,Moiz Ryant LUBECK 08/28/2014, 11:37 AM

## 2014-08-28 NOTE — Addendum Note (Signed)
Addendum  created 08/28/14 0233 by Molli Hazardheresa M Saban Heinlen, CRNA   Modules edited: Charges VN

## 2014-08-28 NOTE — Anesthesia Postprocedure Evaluation (Signed)
  Anesthesia Post-op Note  Patient: Joshua SledgeGary E Fields  Procedure(s) Performed: Procedure(s): INTRAMEDULLARY (IM) NAIL TIBIAL (Left)  Patient Location: PACU  Anesthesia Type:General  Level of Consciousness: awake, alert , oriented and patient cooperative  Airway and Oxygen Therapy: Patient Spontanous Breathing and Patient connected to nasal cannula oxygen  Post-op Pain: mild  Post-op Assessment: Post-op Vital signs reviewed, Patient's Cardiovascular Status Stable, Respiratory Function Stable, Patent Airway, No signs of Nausea or vomiting and Pain level controlled  Post-op Vital Signs: Reviewed and stable  Last Vitals:  Filed Vitals:   08/28/14 0000  BP: 131/58  Pulse: 81  Temp:   Resp: 12    Complications: No apparent anesthesia complications

## 2014-08-28 NOTE — Consult Note (Signed)
Physical Medicine and Rehabilitation Consult Reason for Consult:closed displaced left tibial shaft fracture, comminuted left distal fibular fracture Referring Physician: Dr. Victorino Dike   HPI: Joshua Fields is a 63 y.o.right handed male with history of diabetes mellitus peripheral neuropathy, hypertension, pulmonary emboli with DVT maintained on chronic Coumadin.Independent prior to admission living with his wife.Admitted 11/01/2015after a fall from a tree approximately 10 feet landing on left leg. There was no loss of consciousness. INR on admission of 1.89.Cranial CT scan negative.CT abdomen and pelvis with numerous rib fractures and several compression fractures in the spine appear to be chronic and not acute in nature.X-rays and imaging showed a closed displaced left tibial shaft fracture as well as closed displaced comminuted left distal fibular fracture.Underwent open treatment of left tibial shaft fracture with intramedullary nailing closed treatment of left distal fibular fracture 08/28/2014 per Dr. Victorino Dike.Weightbearing as tolerated left lower extremity with cam boot. Hospital course pain management. Chronic Coumadin therapy resumed.Physical therapy evaluation completed 08/28/2014 with recommendations of physical medicine rehabilitation consult.  Patient is lethargic but awakens to voice Review of Systems  Cardiovascular: Positive for palpitations.  Gastrointestinal: Positive for constipation.  Musculoskeletal: Positive for myalgias.  All other systems reviewed and are negative.  Past Medical History  Diagnosis Date  . Diabetes mellitus without complication   . Hypertension   . PE (pulmonary embolism)   . DVT (deep venous thrombosis)   . Hyperlipidemia    Past Surgical History  Procedure Laterality Date  . Cervical fusion  2008  . Back surgery  1982  . Tibia im nail insertion Left 08/27/2014    DR HEWITT  . Tibia im nail insertion Left 08/27/2014    Procedure: INTRAMEDULLARY  (IM) NAIL TIBIAL;  Surgeon: Toni Arthurs, MD;  Location: [facility] OR;  Service: Orthopedics;  Laterality: Left;   History reviewed. No pertinent family history. Social History:  reports that he has never smoked. His smokeless tobacco use includes Snuff. He reports that he does not drink alcohol or use illicit drugs. Allergies: No Known Allergies Medications Prior to Admission  Medication Sig Dispense Refill  . albuterol (PROVENTIL HFA;VENTOLIN HFA) 108 (90 BASE) MCG/ACT inhaler Inhale 1 puff into the lungs every 6 (six) hours as needed for wheezing or shortness of breath.    . Azelastine-Fluticasone (DYMISTA) 137-50 MCG/ACT SUSP Place 1 spray into the nose daily.    Marland Kitchen ezetimibe-simvastatin (VYTORIN) 10-40 MG per tablet Take 1 tablet by mouth daily.     Marland Kitchen glimepiride (AMARYL) 1 MG tablet Take 1 mg by mouth daily with breakfast.    . HYDROcodone-acetaminophen (NORCO) 10-325 MG per tablet Take 1 tablet by mouth every 6 (six) hours as needed for moderate pain.    . methocarbamol (ROBAXIN) 750 MG tablet Take 750 mg by mouth 4 (four) times daily.    . pregabalin (LYRICA) 50 MG capsule Take 50 mg by mouth 3 (three) times daily.    Marland Kitchen triamterene-hydrochlorothiazide (DYAZIDE) 37.5-25 MG per capsule Take 1 capsule by mouth daily.    Marland Kitchen warfarin (COUMADIN) 4 MG tablet Take 4-8 mg by mouth daily at 6 PM. Takes 8 mg on Fri,Sat,Sun Takes 4mg  all other days      Home: Home Living Family/patient expects to be discharged to:: Private residence Living Arrangements: Spouse/significant other, Children Available Help at Discharge: Available 24 hours/day, Family Type of Home: House Home Access: Stairs to enter Secretary/administrator of Steps: 2 Entrance Stairs-Rails: Right Home Layout: One level, Laundry or work area in  basement Home Equipment: Cane - single point, Walker - 2 wheels  Functional History: Prior Function Level of Independence: Independent with assistive device(s) Comments: Daughter reports pt with  frequent falls due to R LE weakness.   Functional Status:  Mobility: Bed Mobility Overal bed mobility: Needs Assistance, +2 for physical assistance Bed Mobility: Supine to Sit Supine to sit: +2 for physical assistance, Min assist General bed mobility comments: Pt allowed daughter to A getting leg to side of bed by holding big toe and moving it that way. He need help getting trunk upright. Transfers Overall transfer level: Needs assistance Equipment used: Rolling walker (2 wheeled) Transfers: Sit to/from Stand, [employer] Transfers Sit to Stand: Mod assist, +2 physical assistance Stand pivot transfers: Total assist General transfer comment: CAM boot in room too small for patient and requested an XL. Kept pt as NWB L LE during treatment.  He was able to stand, but could not shimmy or hop on R foot.  Bed was moved and recliner chair brought behind patient.      ADL:    Cognition: Cognition Overall Cognitive Status: Within Functional Limits for tasks assessed Orientation Level: Oriented to person Cognition Arousal/Alertness: Awake/alert, Lethargic, Suspect due to medications Behavior During Therapy: WFL for tasks assessed/performed Overall Cognitive Status: Within Functional Limits for tasks assessed  Blood pressure 126/71, pulse 94, temperature 98.5 F (36.9 C), temperature source Oral, resp. rate 16, height 5\' 7"  (1.702 m), weight 119.75 kg (264 lb), SpO2 94 %. Physical Exam  Constitutional: He appears well-developed.  HENT:  Head: Normocephalic.  Eyes: EOM are normal.  Neck: Normal range of motion. Neck supple. No thyromegaly present.  Cardiovascular: Normal rate and regular rhythm.   Respiratory: Effort normal and breath sounds normal. No respiratory distress.  GI: Soft. Bowel sounds are normal. He exhibits no distension.  Neurological:  Patient is a bit lethargic but arousable. Oriented 3. Follows simple commands.  Skin: Skin is warm and dry.  Left lower extremity with  bulky dressing in place  Motor strength is 3 minus left deltoid 4 left bicep tricep grip 5 in the right deltoid, bicep, tricep, grip Right lower extremity 4/5 hip flexor and extensor ankle dorsiflexor plantar flexor Left lower extremity to minus hip flexor, 3 minus knee extensor ankle not tested secondary to splint Sensory intact to light touch in the hands and in the feet  Results for orders placed or performed during the hospital encounter of 08/27/14 (from the past 24 hour(s))  CDS serology     Status: None   Collection Time: 08/27/14  4:18 PM  Result Value Ref Range   CDS serology specimen CDSCMT   Comprehensive metabolic panel     Status: Abnormal   Collection Time: 08/27/14  4:18 PM  Result Value Ref Range   Sodium 139 137 - 147 mEq/L   Potassium 4.4 3.7 - 5.3 mEq/L   Chloride 100 96 - 112 mEq/L   CO2 27 19 - 32 mEq/L   Glucose, Bld 108 (H) 70 - 99 mg/dL   BUN 18 6 - 23 mg/dL   Creatinine, Ser 1.61 0.50 - 1.35 mg/dL   Calcium 8.9 8.4 - 09.6 mg/dL   Total Protein 6.7 6.0 - 8.3 g/dL   Albumin 3.7 3.5 - 5.2 g/dL   AST 24 0 - 37 U/L   ALT 16 0 - 53 U/L   Alkaline Phosphatase 58 39 - 117 U/L   Total Bilirubin 0.4 0.3 - 1.2 mg/dL   GFR calc  non Af Amer >90 >90 mL/min   GFR calc Af Amer >90 >90 mL/min   Anion gap 12 5 - 15  CBC     Status: Abnormal   Collection Time: 08/27/14  4:18 PM  Result Value Ref Range   WBC 8.5 4.0 - 10.5 K/uL   RBC 3.89 (L) 4.22 - 5.81 MIL/uL   Hemoglobin 12.7 (L) 13.0 - 17.0 g/dL   HCT 09.8 (L) 11.9 - 14.7 %   MCV 95.1 78.0 - 100.0 fL   MCH 32.6 26.0 - 34.0 pg   MCHC 34.3 30.0 - 36.0 g/dL   RDW 82.9 56.2 - 13.0 %   Platelets 181 150 - 400 K/uL  Ethanol     Status: None   Collection Time: 08/27/14  4:18 PM  Result Value Ref Range   Alcohol, Ethyl (B) <11 0 - 11 mg/dL  Protime-INR     Status: Abnormal   Collection Time: 08/27/14  4:18 PM  Result Value Ref Range   Prothrombin Time 21.9 (H) 11.6 - 15.2 seconds   INR 1.89 (H) 0.00 - 1.49  Sample  to Blood Bank     Status: None   Collection Time: 08/27/14  5:23 PM  Result Value Ref Range   Blood Bank Specimen SAMPLE AVAILABLE FOR TESTING    Sample Expiration 08/28/2014   Urinalysis, Routine w reflex microscopic     Status: None   Collection Time: 08/27/14  8:28 PM  Result Value Ref Range   Color, Urine YELLOW YELLOW   APPearance CLEAR CLEAR   Specific Gravity, Urine 1.023 1.005 - 1.030   pH 5.5 5.0 - 8.0   Glucose, UA NEGATIVE NEGATIVE mg/dL   Hgb urine dipstick NEGATIVE NEGATIVE   Bilirubin Urine NEGATIVE NEGATIVE   Ketones, ur NEGATIVE NEGATIVE mg/dL   Protein, ur NEGATIVE NEGATIVE mg/dL   Urobilinogen, UA 0.2 0.0 - 1.0 mg/dL   Nitrite NEGATIVE NEGATIVE   Leukocytes, UA NEGATIVE NEGATIVE  Glucose, capillary     Status: Abnormal   Collection Time: 08/28/14 12:37 AM  Result Value Ref Range   Glucose-Capillary 110 (H) 70 - 99 mg/dL  Glucose, capillary     Status: Abnormal   Collection Time: 08/28/14  1:04 AM  Result Value Ref Range   Glucose-Capillary 162 (H) 70 - 99 mg/dL  Protime-INR     Status: Abnormal   Collection Time: 08/28/14  5:33 AM  Result Value Ref Range   Prothrombin Time 22.1 (H) 11.6 - 15.2 seconds   INR 1.91 (H) 0.00 - 1.49  CBC     Status: Abnormal   Collection Time: 08/28/14  5:33 AM  Result Value Ref Range   WBC 7.0 4.0 - 10.5 K/uL   RBC 3.67 (L) 4.22 - 5.81 MIL/uL   Hemoglobin 11.8 (L) 13.0 - 17.0 g/dL   HCT 86.5 (L) 78.4 - 69.6 %   MCV 96.2 78.0 - 100.0 fL   MCH 32.2 26.0 - 34.0 pg   MCHC 33.4 30.0 - 36.0 g/dL   RDW 29.5 28.4 - 13.2 %   Platelets 165 150 - 400 K/uL  Glucose, capillary     Status: Abnormal   Collection Time: 08/28/14  6:48 AM  Result Value Ref Range   Glucose-Capillary 126 (H) 70 - 99 mg/dL  Glucose, capillary     Status: Abnormal   Collection Time: 08/28/14 12:21 PM  Result Value Ref Range   Glucose-Capillary 118 (H) 70 - 99 mg/dL   Dg Tibia/fibula Left  08/28/2014  CLINICAL DATA:  Left tibial fracture.  Intra  medullary nail.  EXAM: DG C-ARM 61-120 MIN; LEFT TIBIA AND FIBULA - 2 VIEW  COMPARISON:  None.  FINDINGS: Intraoperative fluoroscopy was utilized for surgical control purposes, fluoroscopy time recorded at 1 min 1 second.  Spot fluoroscopic images obtained of the left tibia demonstrate placement of an intra medullary rod with 2 proximal and 2 distal locking screws. Mostly transverse comminuted fractures are demonstrated in the proximal left tibial metaphysis and in the midshaft left tibia. Tibial fracture fragments appear to be in near anatomic alignment. There also comminuted fractures of the distal left fibula without internal fixation.  IMPRESSION: Intraoperative fluoroscopy obtained for surgical control purposes, demonstrating intra medullary rod fixation of fractures of the left tibia.   Electronically Signed   By: Burman Nieves M.D.   On: 08/28/2014 00:10   Dg Tibia/fibula Left  08/27/2014   CLINICAL DATA:  Fall from tree stand with leg pain  EXAM: LEFT TIBIA AND FIBULA - 2 VIEW  COMPARISON:  None.  FINDINGS: There are multiple fracture is identified. There is a transverse fracture through the mid the distal tibial diaphysis as well as a diffusely comminuted fracture of the distal fibular metaphysis. Additionally an undisplaced fracture is noted in the proximal tibia. Soft tissue swelling is noted. No disruption of the ankle joint is noted.  IMPRESSION: Multiple tibial and fibular fractures.   Electronically Signed   By: Alcide Clever M.D.   On: 08/27/2014 18:20   Ct Head Wo Contrast  08/27/2014   CLINICAL DATA:  Fall of a tree stand. Prior neck surgery 12 years ago.  EXAM: CT HEAD WITHOUT CONTRAST  CT CERVICAL SPINE WITHOUT CONTRAST  TECHNIQUE: Multidetector CT imaging of the head and cervical spine was performed following the standard protocol without intravenous contrast. Multiplanar CT image reconstructions of the cervical spine were also generated.  COMPARISON:  Multiple exams, including  12/23/2007 and 09/29/2007  FINDINGS: CT HEAD FINDINGS  The brainstem, cerebellum, cerebral peduncles, thalamus, basal ganglia, basilar cisterns, and ventricular system appear within normal limits. No intracranial hemorrhage, mass lesion, or acute CVA. Polypoid mucoperiosteal thickening noted in both maxillary sinuses. Nasal polyps are present and there is opacification of multiple ethmoid air cells and mucoperiosteal thickening in multiple ethmoid air cells. Chronic right frontal sinusitis.  Patient appears to have multiple dental cavities including the left maxillary molars and the medial right maxillary premolar. Also the right maxillary canine appears to be only partially erupted.  CT CERVICAL SPINE FINDINGS  Anterior plate and screw fixator at C5-C6-C7 with solid interbody fusion an without significant osseous foraminal stenosis at these levels. Uncinate and facet spurring cause mild osseous foraminal narrowing on the left at C4-5.  The dorsal column stimulator leads observed at the C6 and C7 levels.  Degenerative spurring and the anterior C1-2 articulation. No vertebral subluxation is observed.  No cervical spine fracture observed.  IMPRESSION: 1. No acute intracranial or cervical spine findings. 2. Chronic paranasal sinusitis. 3. Multiple dental cavities. 4. Osseous foraminal stenosis on the left at C4-5. 5. Dorsal column stimulator leads posteriorly at C6-7.   Electronically Signed   By: Herbie Baltimore M.D.   On: 08/27/2014 19:09   Ct Chest W Contrast  08/27/2014   CLINICAL DATA:  Fall from a tree stand.  Left leg pain.  EXAM: CT CHEST, ABDOMEN, AND PELVIS WITH CONTRAST  TECHNIQUE: Multidetector CT imaging of the chest, abdomen and pelvis was performed following the standard protocol during bolus  administration of intravenous contrast.  CONTRAST:  OMNIPAQUE IOHEXOL 300 MG/ML  SOLN  COMPARISON:  Chest radiograph from 08/27/2014 ; prior chest CT dated 12/23/2007  FINDINGS: CT CHEST FINDINGS  No  aortic dissection or mediastinal hematoma. No obvious signs of pulmonary embolus although today's exam was performed as a regular diagnostic CT and accordingly does not have high sensitivity for smaller emboli. Atherosclerotic aortic arch. Scattered small thoracic lymph nodes are not pathologically enlarged. The no pleural effusion.  Lingular bulla, similar to prior. No pneumothorax. Dependent subsegmental atelectasis in both lower lobes. No pulmonary contusion.  Various left-sided rib deformities are new compared to 2/20 03/2008 but appear to likely be chronic/remote rib fractures. However, a left anterior seventh rib fracture has some immature/woven bone wound along its margins indicating a late subacute fracture. This is not thought to be from an immediately acute injury. Correlate with the patient's trauma history.  Deformity of the right scapula likewise appears old. Old right rib fractures noted.  Prior posterior decompression from T7 through T9. Vertebral hemangioma at T10 Schmorl's node along the superior endplate of T12. Abnormal anterior wedging of the T3 vertebra with about 45% loss of height, new compared to 12/23/2007, but probably old or late subacute given the overall appearance.  CT ABDOMEN AND PELVIS FINDINGS  Hepatobiliary: Unremarkable  Pancreas: Unremarkable  Spleen: 6 mm hypodense lesion in the dome of the spleen, image 37 series 2, not readily apparent on the prior CT scan from 12/23/2007.  Adrenals/Urinary Tract: 1.2 by 1.2 cm left adrenal nodule, washout characteristics typical for adenoma. Is 2.7 by 3.6 cm cyst of the left mid to lower kidney.  Stomach/Bowel: Scattered descending and sigmoid colon diverticula.  Vascular/Lymphatic: Aortoiliac atherosclerotic vascular disease.  Reproductive: Mild prominence of the prostate gland which measures 5.2 by 4.0 cm on image 112 of series 2. No prostate asymmetry.  Other: No supplemental non-categorized findings.  Musculoskeletal: Does bridging bone  across the left posterior sacroiliac joint. Posterolateral rod and pedicle screw fixation at L4-L5-S1 with solid facet fusion and 1.3 cm anterolisthesis at L5-S1. Chronic appearing wedge compression fracture at L1 with 25% loss of height and about 3 mm of posterior bony retropulsion which appears chronic.  Umbilical hernia contains a 4.2 by 3.2 by 3.0 cm herniated segmental omental adipose tissue.  IMPRESSION: 1. Peripheral hypodense lesion in the dome of the spleen is rounded and not associated with perisplenic ascites. Accordingly I favor this is being a small benign splenic hypodense lesion rather than a grade 1 laceration. On the other hand, the lesion was not well visualized on the prior CT chest from 12/23/2007. Accordingly, there is a small chance that this could represent a tiny peripheral grade 1 splenic laceration. 2. Numerous rib fractures and several compression fractures in the spine seemed chronic or late subacute, and not acute in nature. Similarly the right scapular fracture looks old. 3. Small left adrenal adenoma. 4. Various incidental/cancel area findings, including atherosclerosis, mildly prominent prostate gland, mild sigmoid colon diverticulosis, postoperative findings in the thoracic and lumbar spine, and a moderate-sized umbilical hernia containing omental adipose tissue.   Electronically Signed   By: Herbie Baltimore M.D.   On: 08/27/2014 20:54   Ct Cervical Spine Wo Contrast  08/27/2014   CLINICAL DATA:  Fall of a tree stand. Prior neck surgery 12 years ago.  EXAM: CT HEAD WITHOUT CONTRAST  CT CERVICAL SPINE WITHOUT CONTRAST  TECHNIQUE: Multidetector CT imaging of the head and cervical spine was performed following the  standard protocol without intravenous contrast. Multiplanar CT image reconstructions of the cervical spine were also generated.  COMPARISON:  Multiple exams, including 12/23/2007 and 09/29/2007  FINDINGS: CT HEAD FINDINGS  The brainstem, cerebellum, cerebral peduncles,  thalamus, basal ganglia, basilar cisterns, and ventricular system appear within normal limits. No intracranial hemorrhage, mass lesion, or acute CVA. Polypoid mucoperiosteal thickening noted in both maxillary sinuses. Nasal polyps are present and there is opacification of multiple ethmoid air cells and mucoperiosteal thickening in multiple ethmoid air cells. Chronic right frontal sinusitis.  Patient appears to have multiple dental cavities including the left maxillary molars and the medial right maxillary premolar. Also the right maxillary canine appears to be only partially erupted.  CT CERVICAL SPINE FINDINGS  Anterior plate and screw fixator at C5-C6-C7 with solid interbody fusion an without significant osseous foraminal stenosis at these levels. Uncinate and facet spurring cause mild osseous foraminal narrowing on the left at C4-5.  The dorsal column stimulator leads observed at the C6 and C7 levels.  Degenerative spurring and the anterior C1-2 articulation. No vertebral subluxation is observed.  No cervical spine fracture observed.  IMPRESSION: 1. No acute intracranial or cervical spine findings. 2. Chronic paranasal sinusitis. 3. Multiple dental cavities. 4. Osseous foraminal stenosis on the left at C4-5. 5. Dorsal column stimulator leads posteriorly at C6-7.   Electronically Signed   By: Herbie BaltimoreWalt  Liebkemann M.D.   On: 08/27/2014 19:09   Ct Abdomen Pelvis W Contrast  08/27/2014   CLINICAL DATA:  Fall from a tree stand.  Left leg pain.  EXAM: CT CHEST, ABDOMEN, AND PELVIS WITH CONTRAST  TECHNIQUE: Multidetector CT imaging of the chest, abdomen and pelvis was performed following the standard protocol during bolus administration of intravenous contrast.  CONTRAST:  100mL OMNIPAQUE IOHEXOL 300 MG/ML  SOLN  COMPARISON:  Chest radiograph from 08/27/2014 ; prior chest CT dated 12/23/2007  FINDINGS: CT CHEST FINDINGS  No aortic dissection or mediastinal hematoma. No obvious signs of pulmonary embolus although today's  exam was performed as a regular diagnostic CT and accordingly does not have high sensitivity for smaller emboli. Atherosclerotic aortic arch. Scattered small thoracic lymph nodes are not pathologically enlarged. The no pleural effusion.  Lingular bulla, similar to prior. No pneumothorax. Dependent subsegmental atelectasis in both lower lobes. No pulmonary contusion.  Various left-sided rib deformities are new compared to 2/20 03/2008 but appear to likely be chronic/remote rib fractures. However, a left anterior seventh rib fracture has some immature/woven bone wound along its margins indicating a late subacute fracture. This is not thought to be from an immediately acute injury. Correlate with the patient's trauma history.  Deformity of the right scapula likewise appears old. Old right rib fractures noted.  Prior posterior decompression from T7 through T9. Vertebral hemangioma at T10 Schmorl's node along the superior endplate of T12. Abnormal anterior wedging of the T3 vertebra with about 45% loss of height, new compared to 12/23/2007, but probably old or late subacute given the overall appearance.  CT ABDOMEN AND PELVIS FINDINGS  Hepatobiliary: Unremarkable  Pancreas: Unremarkable  Spleen: 6 mm hypodense lesion in the dome of the spleen, image 37 series 2, not readily apparent on the prior CT scan from 12/23/2007.  Adrenals/Urinary Tract: 1.2 by 1.2 cm left adrenal nodule, washout characteristics typical for adenoma. Is 2.7 by 3.6 cm cyst of the left mid to lower kidney.  Stomach/Bowel: Scattered descending and sigmoid colon diverticula.  Vascular/Lymphatic: Aortoiliac atherosclerotic vascular disease.  Reproductive: Mild prominence of the prostate gland which  measures 5.2 by 4.0 cm on image 112 of series 2. No prostate asymmetry.  Other: No supplemental non-categorized findings.  Musculoskeletal: Does bridging bone across the left posterior sacroiliac joint. Posterolateral rod and pedicle screw fixation at L4-L5-S1  with solid facet fusion and 1.3 cm anterolisthesis at L5-S1. Chronic appearing wedge compression fracture at L1 with 25% loss of height and about 3 mm of posterior bony retropulsion which appears chronic.  Umbilical hernia contains a 4.2 by 3.2 by 3.0 cm herniated segmental omental adipose tissue.  IMPRESSION: 1. Peripheral hypodense lesion in the dome of the spleen is rounded and not associated with perisplenic ascites. Accordingly I favor this is being a small benign splenic hypodense lesion rather than a grade 1 laceration. On the other hand, the lesion was not well visualized on the prior CT chest from 12/23/2007. Accordingly, there is a small chance that this could represent a tiny peripheral grade 1 splenic laceration. 2. Numerous rib fractures and several compression fractures in the spine seemed chronic or late subacute, and not acute in nature. Similarly the right scapular fracture looks old. 3. Small left adrenal adenoma. 4. Various incidental/cancel area findings, including atherosclerosis, mildly prominent prostate gland, mild sigmoid colon diverticulosis, postoperative findings in the thoracic and lumbar spine, and a moderate-sized umbilical hernia containing omental adipose tissue.   Electronically Signed   By: Herbie Baltimore M.D.   On: 08/27/2014 20:54   Dg Pelvis Portable  08/27/2014   CLINICAL DATA:  Tree stand  EXAM: PORTABLE PELVIS 1-2 VIEWS  COMPARISON:  None.  FINDINGS: There is no evidence of pelvic fracture or dislocation. There is slight symmetric narrowing of both hip joints. There is postoperative change at L5 and S1.  IMPRESSION: Mild symmetric narrowing both hip joints. No apparent fracture or dislocation.   Electronically Signed   By: Bretta Bang M.D.   On: 08/27/2014 18:18   Dg Chest Port 1 View  08/27/2014   CLINICAL DATA:  Patient fell from tree stand.  Pain  EXAM: PORTABLE CHEST - 1 VIEW  COMPARISON:  Chest CT December 23, 2007  FINDINGS: There is no edema or  consolidation. Heart is mildly enlarged with pulmonary vascularity within normal limits. No adenopathy. No pneumothorax. There are several apparent rib fractures on the right. There is a stimulator with the proximal aspect overlying the lower cervical region ; the actual tip is not seen. There is postoperative change in the lower cervical region.  IMPRESSION: Findings suspicious for several rib fractures on the right. No pneumothorax. No edema or consolidation. Heart is mildly enlarged.   Electronically Signed   By: Bretta Bang M.D.   On: 08/27/2014 18:17   Dg C-arm 1-60 Min  08/28/2014   CLINICAL DATA:  Left tibial fracture.  Intra medullary nail.  EXAM: DG C-ARM 61-120 MIN; LEFT TIBIA AND FIBULA - 2 VIEW  COMPARISON:  None.  FINDINGS: Intraoperative fluoroscopy was utilized for surgical control purposes, fluoroscopy time recorded at 1 min 1 second.  Spot fluoroscopic images obtained of the left tibia demonstrate placement of an intra medullary rod with 2 proximal and 2 distal locking screws. Mostly transverse comminuted fractures are demonstrated in the proximal left tibial metaphysis and in the midshaft left tibia. Tibial fracture fragments appear to be in near anatomic alignment. There also comminuted fractures of the distal left fibula without internal fixation.  IMPRESSION: Intraoperative fluoroscopy obtained for surgical control purposes, demonstrating intra medullary rod fixation of fractures of the left tibia.   Electronically Signed  By: Burman NievesWilliam  Stevens M.D.   On: 08/28/2014 00:10    Assessment/Plan: Diagnosis: Multitrauma after fall postop day #1 status post IM nail tibial fracture 1. Does the need for close, 24 hr/day medical supervision in concert with the patient's rehab needs make it unreasonable for this patient to be served in a less intensive setting? Potentially 2. 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 Due to bladder management, bowel management, safety, skin/wound care, disease management, medication administration, pain management and patient education, does the patient require 24 hr/day rehab nursing? Potentially 4. Does the patient require coordinated care of a physician, rehab nurse, PT (1-2 hrs/day, 5 days/week) and OT (1-2 hrs/day, 5 days/week) to address physical and functional deficits in the context of the above medical diagnosis(es)? Yes and Potentially Addressing deficits in the following areas: balance, endurance, locomotion, strength, transferring, bowel/bladder control, bathing, dressing, toileting and cognition 5. Can the patient actively participate in an intensive therapy program of at least 3 hrs of therapy per day at least 5 days per week? Potentially 6. The potential for patient to make measurable gains while on inpatient rehab is good 7. Anticipated functional outcomes upon discharge from inpatient rehab are supervision  with PT, supervision with OT, n/a with SLP. 8. Estimated rehab length of stay to reach the above functional goals is: 12-16 days 9. Does the patient have adequate social supports to accommodate these discharge functional goals? Potentially 10. Anticipated D/C setting: Home 11. Anticipated post D/C treatments: HH therapy 12. Overall Rehab/Functional Prognosis: good  RECOMMENDATIONS: This patient's condition is appropriate for continued rehabilitative care in the following setting: CIR once patient is able to tolerate therapy  and is less lethargic Patient has agreed to participate in recommended program. Potentially Note that insurance prior authorization may be required for reimbursement for recommended care.  Comment: Acute on chronic pain may be difficult to manage    08/28/2014

## 2014-08-28 NOTE — Op Note (Signed)
NAME:  Joshua Fields, Joshua Fields                  ACCOUNT NO.:  [id number]  MEDICAL RECORD NO.:  [id number]  LOCATION:  [facility]                         FACILITY:  [facility]  PHYSICIAN:  Toni ArthursJohn Cherl Gorney, MD        DATE OF BIRTH:  November 02, 1950  DATE OF PROCEDURE:  08/27/2014 DATE OF DISCHARGE:                              OPERATIVE REPORT   PREOPERATIVE DIAGNOSES: 1. Closed displaced left tibial shaft fracture. 2. Closed displaced comminuted left distal fibular fracture.  POSTOPERATIVE DIAGNOSES: 1. Closed displaced left tibial shaft fracture. 2.  Closed displaced     comminuted left distal fibular fracture.  PROCEDURE: 1. Open treatment of left tibial shaft fracture with intramedullary     nailing. 2. Closed treatment of left distal fibular fracture.  SURGEON:  Toni ArthursJohn Tiki Tucciarone, MD  ANESTHESIA:  General.  ESTIMATED BLOOD LOSS:  Minimal.  TOURNIQUET TIME:  53 minutes at 250 mmHg.  COMPLICATIONS:  None apparent.  DISPOSITION:  Extubated, awake, and stable to recovery.  INDICATIONS FOR PROCEDURE:  The patient is a 63 year old male with past medical history significant for DVT and PE.  He takes Coumadin.  He fell from a tree stand earlier today about 10 feet landing on his left leg. He denies any other injuries.  He had radiographs which reveal a comminuted fracture of the distal fibula as well as a segmental fracture of the tibial shaft that is displaced.  He presents now for operative treatment of this closed displaced left tibial shaft and distal fibular fractures.  He understands the risks and benefits, the alternative treatment options, and elects surgical treatment.  He specifically understands risks of bleeding, infection, nerve damage, blood clots, need for additional surgery, continued pain, amputation, and death.  PROCEDURE IN DETAIL:  After preoperative consent was obtained and the correct operative site was identified, the patient was brought to the operating room and placed supine on the  operating table.  General anesthesia was induced.  Preoperative antibiotics were administered. Surgical time-out was taken.  Left lower extremity was prepped and draped in standard sterile fashion with tourniquet around the thigh. The extremity was exsanguinated.  The tourniquet was inflated to 250 mmHg.  A lateral parapatellar incision was made to the knee.  Sharp dissection was carried down through the skin and subcutaneous tissue. The extensor retinaculum was incised.  The interval between the patellar tendon and the fat pad was developed.  A guide pin was inserted at the proximal end of the tibia in-line with the tibial shaft on the AP and lateral planes.  AP and lateral radiographs were obtained confirming appropriate position of the guide pin.  The guide pin was inserted into the medullary canal.  An awl was used to open medullary canal over the guide pin.  The guide pin was removed.  A ball-tip guidewire was then advanced down the medullary canal.  It was advanced across both fracture sites while holding the fractures reduced.  AP and lateral radiographs confirmed appropriate reduction of the fractures and appropriate position of the guide pin.  The guide pin was then sequentially reamed to a diameter of 11.5 mm.  A 10 mm x 360 mm Biomet VersaNail  was selected and inserted into the tibial canal over the guidewire using the insertion jig.  The guidewire was removed.  The AP and lateral radiographs then confirmed appropriate positioning of the nail and appropriate reduction of both fractures.  The targeting guide was used to insert 2 percutaneous screws proximally.  Distally, the perfect circle technique was used to insert 2 more percutaneous interlocking screws from medial to lateral.  Final AP and lateral radiographs at the knee, proximal and distal fracture sites, and ankle showed appropriate position and length of all hardware and appropriate reduction of the fractures.  All the  wounds were irrigated copiously.  The knee wound was closed with 0 Vicryl at the level of the retinaculum and Monocryl subcutaneous tissue, a running nylon suture was used to close the skin incision.  The remaining percutaneous stab incisions were closed with horizontal mattress sutures of 3-0 nylon.  Sterile dressings were applied followed by a sterile compression wrap.  The tourniquet was released at 53 minutes after application of the dressings.  The patient was awakened from anesthesia and transported to the recovery room in stable condition.  FOLLOWUP PLAN:  The patient will be weightbearing as tolerated on the left lower extremity in a CAM walker boot.  He will be admitted for physical therapy and occupational therapy and discharge planning.  We will continue his Coumadin in the postoperative period.     Toni ArthursJohn Lottie Sigman, MD     JH/MEDQ  D:  08/27/2014  T:  08/28/2014  Job:  [id number]

## 2014-08-28 NOTE — Progress Notes (Signed)
ANTICOAGULATION CONSULT NOTE - Initial Consult  Pharmacy Consult for Warfarin  Indication: VTE prophylaxis, hx of DVT/PE  No Known Allergies  Patient Measurements: Height: 5\' 7"  (170.2 cm) Weight: 264 lb (119.75 kg) IBW/kg (Calculated) : 66.1  Vital Signs: Temp: 98.3 F (36.8 C) (11/02 0043) BP: 132/74 mmHg (11/02 0043) Pulse Rate: 85 (11/02 0043)  Labs:  Recent Labs  08/27/14 1618  HGB 12.7*  HCT 37.0*  PLT 181  LABPROT 21.9*  INR 1.89*  CREATININE 0.78    Estimated Creatinine Clearance: 117.1 mL/min (by C-G formula based on Cr of 0.78).   Medical History: Past Medical History  Diagnosis Date  . Diabetes mellitus without complication   . Hypertension   . PE (pulmonary embolism)   . DVT (deep venous thrombosis)     Assessment: 63 y/o M who is already on warfarin for hx DVT/PE, now continuing warfarin which will also serve as VTE prophylaxis s/p intramedullary nailing of left tibia. INR on 11/1 was 1.89, Hgb 12.7 (expect some post-op decrease), other labs as above.   Goal of Therapy:  INR 2-3 Monitor platelets by anticoagulation protocol: Yes   Plan:  -Warfarin 7.5 mg PO x 1 tonight at 1800 -Daily PT/INR -Monitor for bleeding  Abran DukeLedford, Daniesha Driver 08/28/2014,1:08 AM

## 2014-08-28 NOTE — Progress Notes (Signed)
Orthopedic Tech Progress Note Patient Details:  Joshua SledgeGary E Fields 04/27/1951 [id number] OHF applied to bed Patient ID: Joshua SledgeGary E Fields, male   DOB: 09/01/1951, 63 y.o.   MRN: [id number]   Joshua Fields 08/28/2014, 1:13 PM

## 2014-08-28 NOTE — Plan of Care (Signed)
Problem: Phase I Progression Outcomes Goal: Voiding-avoid urinary catheter unless indicated Outcome: Completed/Met Date Met:  08/28/14

## 2014-08-28 NOTE — Progress Notes (Signed)
Orthopedic Tech Progress Note Patient Details:  Lourdes SledgeGary E Plascencia 08/17/1951 [id number] CAM walker delivered to room for application to LLE. Patient declined to put boot on at this time, stating he would rather wait until therapy. Boot left in room. Nurse notified. Ortho Devices Type of Ortho Device: CAM walker Ortho Device/Splint Interventions: Ordered   [place] R Thompson 08/28/2014, 8:26 AM

## 2014-08-28 NOTE — Progress Notes (Signed)
Subjective: 1 Day Post-Op Procedure(s) (LRB): INTRAMEDULLARY (IM) NAIL TIBIAL (Left) Patient reports pain as moderate.  He is accompanied today by his daughter, at the bedside. He and she state that the pain medications do not appear to be controlling his pain well. They help initially, but wear off fairly quickly. He has been taking 10mg  of oxycodone at a time and IV dilauded every 2 hours. He is currently on coumadin for a previous PE. He has not other complaints other than pain. He denies any new HA, CP, SOB, N, V, fever, chills, calf pain or swelling. His appetite is returning. He has not had a BM yet.  Objective: Vital signs in last 24 hours: Temp:  [98 F (36.7 C)-98.7 F (37.1 C)] 98.5 F (36.9 C) (11/02 0541) Pulse Rate:  [69-95] 94 (11/02 0541) Resp:  [10-22] 16 (11/02 0541) BP: (103-159)/(49-93) 126/71 mmHg (11/02 0541) SpO2:  [94 %-100 %] 94 % (11/02 0541) Weight:  [119.75 kg (264 lb)] 119.75 kg (264 lb) (11/02 0045)  Intake/Output from previous day: 11/01 0701 - 11/02 0700 In: 1500 [I.V.:1500] Out: 450 [Urine:450] Intake/Output this shift:     Recent Labs  08/27/14 1618 08/28/14 0533  HGB 12.7* 11.8*    Recent Labs  08/27/14 1618 08/28/14 0533  WBC 8.5 7.0  RBC 3.89* 3.67*  HCT 37.0* 35.3*  PLT 181 165    Recent Labs  08/27/14 1618  NA 139  K 4.4  CL 100  CO2 27  BUN 18  CREATININE 0.78  GLUCOSE 108*  CALCIUM 8.9    Recent Labs  08/27/14 1618 08/28/14 0533  INR 1.89* 1.91*    Physical Exam: WD/WN caucasian male in mild distress due to pain. A and O x4. Mood and affect appropriate. EOMI. Respirations normal and unlabored with 6L [place] in place. Abdomen soft and non-tender. Soft dressing to LLE. Dressings C/D/I. NV intact with brisk capillary refill and 5/5 strength of toe extensors and flexors bilaterally. Distal sensation intact bilaterally. No lymphadenopathy. No calf swelling or palpable cords.    Assessment/Plan: 1 Day Post-Op Procedure(s)  (LRB): INTRAMEDULLARY (IM) NAIL TIBIAL (Left) Up with therapy; WBAT in Cam Boot  -Per PT and Case management, evaluation for CIR pending -Will increase pain medications to IV dilauded every 1 hour from every 2. Will increase to oxycodone 10mg  from 5mg  tablets -On coumadin for previous PE and DVT prophylaxis  Staysha Truby HOWELLS 08/28/2014, 12:56 PM  [phone]

## 2014-08-28 NOTE — Progress Notes (Signed)
Patient was screened by Gissela Bloch for appropriateness for an Inpatient Acute Rehab consult.  At this time, we are recommending Inpatient Rehab consult.  Please order when you feel appropriate.   Pio Eatherly PT Inpatient Rehab Admissions Coordinator Cell [phone] Office [phone]  

## 2014-08-28 NOTE — Progress Notes (Signed)
Utilization review completed.  

## 2014-08-29 LAB — PROTIME-INR
INR: 1.61 — AB (ref 0.00–1.49)
PROTHROMBIN TIME: 19.3 s — AB (ref 11.6–15.2)

## 2014-08-29 LAB — GLUCOSE, CAPILLARY
GLUCOSE-CAPILLARY: 106 mg/dL — AB (ref 70–99)
Glucose-Capillary: 120 mg/dL — ABNORMAL HIGH (ref 70–99)
Glucose-Capillary: 85 mg/dL (ref 70–99)
Glucose-Capillary: 119 mg/dL — ABNORMAL HIGH (ref 70–99)

## 2014-08-29 MED ORDER — WARFARIN SODIUM 4 MG PO TABS
8.0000 mg | ORAL_TABLET | Freq: Once | ORAL | Status: AC
  Administered 2014-08-29: 8 mg via ORAL
  Filled 2014-08-29: qty 2

## 2014-08-29 NOTE — Progress Notes (Signed)
Pt noted to ask for pain medication yet then be resting w eyes closed very quickly after speaking with RN. Explained to family at this time the importance spacing out sedative medications (narcotics) d/t risk for oversedation since pt already drowsy. Family did not have any adverse reactions. Did explain the same to patient when he was more alert. Pt had verbal understanding.

## 2014-08-29 NOTE — Progress Notes (Signed)
Physical Therapy Treatment Patient Details Name: Joshua SledgeGary E Fields MRN: [id number] DOB: 03/17/1951 Today's Date: 08/29/2014    History of Present Illness 63 y/o male with PMH of PE and diabetes c/o left leg pain since falling from a tree stand earlier this afternoon.  He fell about 10 feet landing on the left leg only.  He denies any pain elsewhere - specifically his lower back and feet.  He denies any LOC.  Closed displaced left tibial shaft fracture and Closed displaced comminuted left distal fibula fracture . Pt is s/p Open treatment of left tibial shaft fracture with intramedullary nailing and Closed treatment of left distal fibula fracture.    PT Comments    Pt more alert today. Pt moving slowly but able to move with less assistance. However, pain is limiting factor and he was still unable to take any steps today.  Wife and son present at today's session.  Discussed d/c planning with them.  If pt is not a candidate for CIR, then would recommend SNF, as pt's wife would not be able to care for pt at his current level.  Follow Up Recommendations  CIR;SNF (SNF if pt is not candidate for CIR)     Equipment Recommendations  3in1 (PT)    Recommendations for Other Services       Precautions / Restrictions Precautions Precautions: Fall Required Braces or Orthoses: Other Brace/Splint (CAM boot when up) Restrictions Weight Bearing Restrictions: Yes LLE Weight Bearing: Weight bearing as tolerated Other Position/Activity Restrictions: with CAM boot (per OP REPORT)    Mobility  Bed Mobility Overal bed mobility: Needs Assistance Bed Mobility: Supine to Sit     Supine to sit: Mod assist     General bed mobility comments: A for getting L LE off bed then gave A to help finish getting trunk upright.  Use of overhead trapeze bar and rail with HOB elevated.  Transfers Overall transfer level: Needs assistance Equipment used: Rolling walker (2 wheeled) Transfers: Sit to/from Stand Sit to Stand:  Min assist Stand pivot transfers: Total assist       General transfer comment: Donned CAM boot while sitting EOB.  Pt with frequent yelling due to pain.  Sat EOB 5 mins due to nausea before agreeing to stand.  PT had demonstrated scooting with R foot to perform SPT with RW.  Pt able to stand with MIN A with elevated bed with pt taking ~ 2minutes between initiating stand to getting upright with both hands on RW.  Pt unable to tolerate any weight through L LE with boot.  Pt unable to hop or scoot R foot to get over to chair.  Bed moved and recliner brought behind pt.  Ambulation/Gait                 Stairs            Wheelchair Mobility    Modified Rankin (Stroke Patients Only)       Balance     Sitting balance-Leahy Scale: Good     Standing balance support: Bilateral upper extremity supported Standing balance-Leahy Scale: Poor                      Cognition Arousal/Alertness: Awake/alert Behavior During Therapy: WFL for tasks assessed/performed Overall Cognitive Status: Within Functional Limits for tasks assessed                      Exercises      General  Comments General comments (skin integrity, edema, etc.): Pt moves slowly and dictates care to therapist, aide, and son on where to lift leg, where not to touch, etc.  Pain limiting factor.      Pertinent Vitals/Pain Pain Assessment: 0-10 Pain Score: 7  Pain Location: L LE Pain Descriptors / Indicators: Aching;Throbbing Pain Intervention(s): Monitored during session;Repositioned;Premedicated before session;Relaxation;Limited activity within patient's tolerance    Home Living                      Prior Function            PT Goals (current goals can now be found in the care plan section) Acute Rehab PT Goals Patient Stated Goal: To move better PT Goal Formulation: With patient/family Time For Goal Achievement: 09/11/14 Potential to Achieve Goals: Good Progress towards PT  goals: Progressing toward goals    Frequency  Min 5X/week    PT Plan Discharge plan needs to be updated    Co-evaluation             End of Session Equipment Utilized During Treatment: Gait belt;Oxygen Activity Tolerance: Patient limited by pain Patient left: in chair;with family/visitor present;with call bell/phone within reach     Time: 0924-0958 PT Time Calculation (min): 34 min  Charges:  $Therapeutic Activity: 23-37 mins                    G Codes:      Joshua Fields,Joshua Fields Joshua Fields 08/29/2014, 10:19 AM

## 2014-08-29 NOTE — Progress Notes (Signed)
OT Cancellation Note  Patient Details Name: Joshua Fields MRN: [id number] DOB: 09/09/1951   Cancelled Treatment:    Reason Eval/Treat Not Completed: Other (comment);Pain limiting ability to participate Pt just finished with PT and is at 7.5/10 pain at rest. OT to reattempt.  Pilar GrammesMathews, Amaia Lavallie H 08/29/2014, 10:12 AM

## 2014-08-29 NOTE — Progress Notes (Signed)
Subjective: 2 Days Post-Op Procedure(s) (LRB): INTRAMEDULLARY (IM) NAIL TIBIAL (Left) Patient reports pain as moderate.  He reports that his pain is much improved compared to yesterday.  He was able to be up with PT, but states that it was difficult and painful.  He has no new complaints at this time. He denies any new HA, CP, SOB, N, V, fever, chills, changes in appetite, calf pain or swelling.   Objective: Vital signs in last 24 hours: Temp:  [98.2 F (36.8 C)-98.6 F (37 C)] 98.6 F (37 C) (11/03 0601) Pulse Rate:  [68-82] 68 (11/03 0601) Resp:  [18] 18 (11/03 0601) BP: (89-118)/(58-72) 89/58 mmHg (11/03 0601) SpO2:  [93 %-97 %] 97 % (11/03 0601)  Intake/Output from previous day: 11/02 0701 - 11/03 0700 In: 1792.5 [P.O.:540; I.V.:1252.5] Out: 250 [Urine:250] Intake/Output this shift: Total I/O In: 240 [P.O.:240] Out: -    Recent Labs  08/27/14 1618 08/28/14 0533  HGB 12.7* 11.8*    Recent Labs  08/27/14 1618 08/28/14 0533  WBC 8.5 7.0  RBC 3.89* 3.67*  HCT 37.0* 35.3*  PLT 181 165    Recent Labs  08/27/14 1618  NA 139  K 4.4  CL 100  CO2 27  BUN 18  CREATININE 0.78  GLUCOSE 108*  CALCIUM 8.9    Recent Labs  08/28/14 0533 08/29/14 0647  INR 1.91* 1.61*   Physical Exam: WD/WN male in nad. A and O x4. Mood and affect appropriate. EOMI. Respirations normal and unlabored with [place] in place. Abdomen soft and non-tender. Soft dressing to LLE. Dressings C/D/I. LLE in Cam [employer] Boot. NV intact with brisk capillary refill and 5/5 strength of toe extensors and flexors bilaterally. Distal sensation intact bilaterally. No lymphadenopathy. No calf swelling or palpable cords.    Assessment/Plan: 2 Days Post-Op Procedure(s) (LRB): INTRAMEDULLARY (IM) NAIL TIBIAL (Left)  Up with therapy; WBAT in Cam Boot  -Per PT and Case management, evaluation for CIR pending -Continue pain meds as prescribed  -On coumadin for previous PE and DVT prophylaxis -D/C plans  pending CIR consult vs SNF placement   Daishaun Ayre HOWELLS 08/29/2014, 12:54 PM  [phone]

## 2014-08-29 NOTE — Progress Notes (Signed)
Patient had some vomiting tonight and I gave him Zofran for his nausea and Dilaudid 1 mg for pain.  I have followed up on his pain throughout the night with Dilaudid.  He reports that his pain level now is more tolerable.  With pain medications and ice to the area, patient describes pain as "burning".  He cannot tolerate anyone touching his leg.

## 2014-08-29 NOTE — Progress Notes (Signed)
ANTICOAGULATION CONSULT NOTE - Follow Up Consult  Pharmacy Consult for Coumadin Indication: VTE prophylaxis and hx PE and DVT  No Known Allergies  Patient Measurements: Height: 5\' 7"  (170.2 cm) Weight: 264 lb (119.75 kg) IBW/kg (Calculated) : 66.1  Vital Signs: Temp: 98.6 F (37 C) (11/03 0601) BP: 89/58 mmHg (11/03 0601) Pulse Rate: 68 (11/03 0601)  Labs:  Recent Labs  08/27/14 1618 08/28/14 0533 08/29/14 0647  HGB 12.7* 11.8*  --   HCT 37.0* 35.3*  --   PLT 181 165  --   LABPROT 21.9* 22.1* 19.3*  INR 1.89* 1.91* 1.61*  CREATININE 0.78  --   --     Estimated Creatinine Clearance: 117.1 mL/min (by C-G formula based on Cr of 0.78).  Assessment:   INR is subtherapeutic (1.61), and has dropped from 1.19 on 11/2.  Coumadin 7.5 mg given last night.  Home Coumadin regimen:  8 mg on Fri, Sat & Sundays, and 4 mg on Mon-Thursdays.  [facility] as outpatient.  He reports that he missed his Coumadin dose at home on 11/1,  then OR early am on 11/2.  Goal of Therapy:  INR 2-3 Monitor platelets by anticoagulation protocol: Yes   Plan:   Coumadin 8 mg today.  Daily PT/INR.  CBC in am.  Dennie Fettersgan, Breauna Mazzeo Donovan, RPh Pager: [phone] 08/29/2014,4:13 PM

## 2014-08-29 NOTE — Evaluation (Signed)
Occupational Therapy Evaluation Patient Details Name: Joshua SledgeGary E Fields MRN: [id number] DOB: 07/06/1951 Today's Date: 08/29/2014    History of Present Illness 63 y/o male with PMH of PE and diabetes c/o left leg pain since falling from a tree stand earlier this afternoon.  He fell about 10 feet landing on the left leg only.  He denies any pain elsewhere - specifically his lower back and feet.  He denies any LOC.  Closed displaced left tibial shaft fracture and Closed displaced comminuted left distal fibula fracture . Pt is s/p Open treatment of left tibial shaft fracture with intramedullary nailing and Closed treatment of left distal fibula fracture.   Clinical Impression   Pt admitted with the above diagnoses and presents with below problem list. Pt will benefit from continued acute OT to address the below listed deficits and maximize independence with basic ADLs prior to d/c to next venue. PTA pt was mod I with ADLs. Pt currently needing +2 max phy assist for LB ADLs. Pt completed sit<>stand from recliner with +2 mod physical assist as detailed below. Pt will need additional rehab to return home at a level of assistance that family can provide 24/7.      Follow Up Recommendations  CIR    Equipment Recommendations  Other (comment) (defer to next venue)    Recommendations for Other Services Rehab consult     Precautions / Restrictions Precautions Precautions: Fall Required Braces or Orthoses: Other Brace/Splint (CAM boot when OOB) Restrictions Weight Bearing Restrictions: Yes LLE Weight Bearing: Weight bearing as tolerated Other Position/Activity Restrictions: with CAM boot (per OP REPORT)      Mobility Bed Mobility      General bed mobility comments: pt in recliner  Transfers Overall transfer level: Needs assistance Equipment used: Rolling walker (2 wheeled) Transfers: Sit to/from Stand Sit to Stand: Mod assist;+2 physical assistance Stand pivot transfers: Total assist        General transfer comment: Pt with nausea and pain at start of session. Difficulty relying on RLE to compensate for pain with LLE weightbearing in CAM boot. Pt noted to rest with R elbow on rw momentarily. Cuesfor safety with rw.    Balance Overall balance assessment: Needs assistance Sitting-balance support: Bilateral upper extremity supported;Feet supported Sitting balance-Leahy Scale: Good     Standing balance support: Bilateral upper extremity supported;During functional activity Standing balance-Leahy Scale: Poor                              ADL Overall ADL's : Needs assistance/impaired Eating/Feeding: Set up;Sitting   Grooming: Set up;Sitting   Upper Body Bathing: Set up;Sitting   Lower Body Bathing: +2 for physical assistance;Maximal assistance;Sit to/from stand   Upper Body Dressing : Set up;Sitting   Lower Body Dressing: Maximal assistance;+2 for physical assistance;Sit to/from Market researcherstand     Toilet Transfer Details (indicate cue type and reason): did not attempt this session, likely slide transfer to Joshua Fields           General ADL Comments: Pt at max +2 phy A level for LB ADLs. Stood for 1.5 minutes with +2 min phy A. Pt with h/o RLE weakness.      Vision                     Perception     Praxis      Pertinent Vitals/Pain Pain Assessment: 0-10 Pain Score: 7  Pain Location: LLE Pain Descriptors /  Indicators: Aching;Throbbing Pain Intervention(s): Limited activity within patient's tolerance;Monitored during session;Premedicated before session;Repositioned;Utilized relaxation techniques     Hand Dominance     Extremity/Trunk Assessment Upper Extremity Assessment Upper Extremity Assessment: Overall WFL for tasks assessed   Lower Extremity Assessment Lower Extremity Assessment: Defer to PT evaluation       Communication Communication Communication: No difficulties   Cognition Arousal/Alertness: Awake/alert Behavior During Therapy: WFL  for tasks assessed/performed Overall Cognitive Status: Within Functional Limits for tasks assessed                     General Comments       Exercises       Shoulder Instructions      Home Living Family/patient expects to be discharged to:: Private residence Living Arrangements: Spouse/significant other;Children Available Help at Discharge: Available 24 hours/day;Family Type of Home: House Home Access: Stairs to enter Entergy CorporationEntrance Stairs-Number of Steps: 2 Entrance Stairs-Rails: Right Home Layout: One level;Laundry or work area in basement     SunGardBathroom Shower/Tub: Producer, television/film/videoWalk-in shower   Bathroom Toilet: Standard     Home Equipment: Gilmer MorCane - single point;Walker - 2 wheels;Bedside commode;Grab bars - tub/shower          Prior Functioning/Environment Level of Independence: Independent with assistive device(s)        Comments: Daughter reports pt with frequent falls due to R LE weakness.      OT Diagnosis: Acute pain   OT Problem List: Impaired balance (sitting and/or standing);Decreased knowledge of use of DME or AE;Decreased knowledge of precautions;Pain   OT Treatment/Interventions: Self-care/ADL training;DME and/or AE instruction;Therapeutic activities;Patient/family education;Balance training    OT Goals(Current goals can be found in the care plan section) Acute Rehab OT Goals Patient Stated Goal: not stated OT Goal Formulation: With patient Time For Goal Achievement: 09/05/14 Potential to Achieve Goals: Good ADL Goals Pt Will Perform Lower Body Dressing: with min assist;with adaptive equipment;sit to/from stand Pt Will Transfer to Toilet: with min assist;stand pivot transfer;bedside commode Pt Will Perform Toileting - Clothing Manipulation and hygiene: with min assist;sit to/from stand  OT Frequency: Min 3X/week   Barriers to D/C: Other (comment) (pt currently needing +2 phy A)          Co-evaluation              End of Session Equipment Utilized  During Treatment: Gait belt;Rolling walker;Other (comment);Oxygen (CAM boot)  Activity Tolerance: Patient limited by pain;Other (comment) (Pt limited by nausea) Patient left: in chair;with call bell/phone within reach   Time: 1132-1155 OT Time Calculation (min): 23 min Charges:  OT General Charges $OT Visit: 1 Procedure OT Evaluation $Initial OT Evaluation Tier I: 1 Procedure OT Treatments $Self Care/Home Management : 8-22 mins G-Codes:    Pilar GrammesMathews, Jvon Meroney H 08/29/2014, 12:14 PM

## 2014-08-29 NOTE — Progress Notes (Signed)
Inpatient Rehabilitation  I met the patient and his wife at the bedside to discuss post acute rehab options.  I provided informational booklets about CIR and answered their questions.  Pt. Verbally states he wants to participate however he is still limited by pain.  If pt. Can show better tolerance to therapy tomorrow, will consider for admission, pending bed availability.  Please call if questions.  Susan Blankenship PT Inpatient Rehab Admissions Coordinator Cell [phone] Office [phone]  

## 2014-08-30 ENCOUNTER — Inpatient Hospital Stay (HOSPITAL_COMMUNITY)
Admission: RE | Admit: 2014-08-30 | Discharge: 2014-09-08 | DRG: 560 | Disposition: A | Discharge status: Home Health | Payer: Medicare Other | Source: Intra-hospital | Attending: Physical Medicine & Rehabilitation | Admitting: Physical Medicine & Rehabilitation

## 2014-08-30 DIAGNOSIS — S82309A Unspecified fracture of lower end of unspecified tibia, initial encounter for closed fracture: Secondary | ICD-10-CM | POA: Insufficient documentation

## 2014-08-30 DIAGNOSIS — Z7901 Long term (current) use of anticoagulants: Secondary | ICD-10-CM | POA: Diagnosis not present

## 2014-08-30 DIAGNOSIS — Z86718 Personal history of other venous thrombosis and embolism: Secondary | ICD-10-CM

## 2014-08-30 DIAGNOSIS — S82292D Other fracture of shaft of left tibia, subsequent encounter for closed fracture with routine healing: Principal | ICD-10-CM

## 2014-08-30 DIAGNOSIS — N390 Urinary tract infection, site not specified: Secondary | ICD-10-CM | POA: Diagnosis present

## 2014-08-30 DIAGNOSIS — B9620 Unspecified Escherichia coli [E. coli] as the cause of diseases classified elsewhere: Secondary | ICD-10-CM | POA: Diagnosis present

## 2014-08-30 DIAGNOSIS — W14XXXA Fall from tree, initial encounter: Secondary | ICD-10-CM | POA: Insufficient documentation

## 2014-08-30 DIAGNOSIS — S82302A Unspecified fracture of lower end of left tibia, initial encounter for closed fracture: Secondary | ICD-10-CM

## 2014-08-30 DIAGNOSIS — D62 Acute posthemorrhagic anemia: Secondary | ICD-10-CM

## 2014-08-30 DIAGNOSIS — K5900 Constipation, unspecified: Secondary | ICD-10-CM | POA: Diagnosis not present

## 2014-08-30 DIAGNOSIS — Z79899 Other long term (current) drug therapy: Secondary | ICD-10-CM | POA: Diagnosis not present

## 2014-08-30 DIAGNOSIS — Z981 Arthrodesis status: Secondary | ICD-10-CM

## 2014-08-30 DIAGNOSIS — W14XXXD Fall from tree, subsequent encounter: Secondary | ICD-10-CM

## 2014-08-30 DIAGNOSIS — R509 Fever, unspecified: Secondary | ICD-10-CM

## 2014-08-30 DIAGNOSIS — S82839A Other fracture of upper and lower end of unspecified fibula, initial encounter for closed fracture: Secondary | ICD-10-CM

## 2014-08-30 DIAGNOSIS — M79669 Pain in unspecified lower leg: Secondary | ICD-10-CM | POA: Insufficient documentation

## 2014-08-30 DIAGNOSIS — R110 Nausea: Secondary | ICD-10-CM

## 2014-08-30 DIAGNOSIS — M7989 Other specified soft tissue disorders: Secondary | ICD-10-CM

## 2014-08-30 DIAGNOSIS — E785 Hyperlipidemia, unspecified: Secondary | ICD-10-CM

## 2014-08-30 DIAGNOSIS — E1142 Type 2 diabetes mellitus with diabetic polyneuropathy: Secondary | ICD-10-CM

## 2014-08-30 DIAGNOSIS — S82202A Unspecified fracture of shaft of left tibia, initial encounter for closed fracture: Secondary | ICD-10-CM

## 2014-08-30 DIAGNOSIS — Z9889 Other specified postprocedural states: Secondary | ICD-10-CM | POA: Diagnosis not present

## 2014-08-30 DIAGNOSIS — I10 Essential (primary) hypertension: Secondary | ICD-10-CM

## 2014-08-30 DIAGNOSIS — S82832D Other fracture of upper and lower end of left fibula, subsequent encounter for closed fracture with routine healing: Secondary | ICD-10-CM | POA: Diagnosis not present

## 2014-08-30 DIAGNOSIS — Z86711 Personal history of pulmonary embolism: Secondary | ICD-10-CM

## 2014-08-30 DIAGNOSIS — T1490XA Injury, unspecified, initial encounter: Secondary | ICD-10-CM

## 2014-08-30 DIAGNOSIS — M79609 Pain in unspecified limb: Secondary | ICD-10-CM | POA: Diagnosis not present

## 2014-08-30 DIAGNOSIS — S82209A Unspecified fracture of shaft of unspecified tibia, initial encounter for closed fracture: Secondary | ICD-10-CM | POA: Insufficient documentation

## 2014-08-30 DIAGNOSIS — S82252A Displaced comminuted fracture of shaft of left tibia, initial encounter for closed fracture: Secondary | ICD-10-CM | POA: Diagnosis not present

## 2014-08-30 LAB — CBC
HEMATOCRIT: 31 % — AB (ref 39.0–52.0)
HEMOGLOBIN: 10.2 g/dL — AB (ref 13.0–17.0)
MCH: 32.2 pg (ref 26.0–34.0)
MCHC: 32.9 g/dL (ref 30.0–36.0)
MCV: 97.8 fL (ref 78.0–100.0)
Platelets: 146 10*3/uL — ABNORMAL LOW (ref 150–400)
RBC: 3.17 MIL/uL — ABNORMAL LOW (ref 4.22–5.81)
RDW: 13.6 % (ref 11.5–15.5)
WBC: 5.1 10*3/uL (ref 4.0–10.5)

## 2014-08-30 LAB — GLUCOSE, CAPILLARY
Glucose-Capillary: 142 mg/dL — ABNORMAL HIGH (ref 70–99)
Glucose-Capillary: 205 mg/dL — ABNORMAL HIGH (ref 70–99)
Glucose-Capillary: 108 mg/dL — ABNORMAL HIGH (ref 70–99)
Glucose-Capillary: 129 mg/dL — ABNORMAL HIGH (ref 70–99)

## 2014-08-30 LAB — PROTIME-INR
INR: 1.49 (ref 0.00–1.49)
Prothrombin Time: 18.1 seconds — ABNORMAL HIGH (ref 11.6–15.2)

## 2014-08-30 MED ORDER — INSULIN ASPART 100 UNIT/ML ~~LOC~~ SOLN
0.0000 [IU] | Freq: Three times a day (TID) | SUBCUTANEOUS | Status: DC
  Administered 2014-09-02 – 2014-09-03 (×3): 2 [IU] via SUBCUTANEOUS
  Administered 2014-09-03: 3 [IU] via SUBCUTANEOUS
  Administered 2014-09-04 – 2014-09-08 (×5): 2 [IU] via SUBCUTANEOUS

## 2014-08-30 MED ORDER — CETYLPYRIDINIUM CHLORIDE 0.05 % MT LIQD
7.0000 mL | Freq: Two times a day (BID) | OROMUCOSAL | Status: DC
  Administered 2014-08-30 – 2014-09-08 (×17): 7 mL via OROMUCOSAL

## 2014-08-30 MED ORDER — GLIMEPIRIDE 1 MG PO TABS
1.0000 mg | ORAL_TABLET | Freq: Every day | ORAL | Status: DC
  Administered 2014-08-31 – 2014-09-08 (×9): 1 mg via ORAL
  Filled 2014-08-30 (×10): qty 1

## 2014-08-30 MED ORDER — SORBITOL 70 % SOLN: 30.0000 mL | Freq: Every day | Status: DC | PRN

## 2014-08-30 MED ORDER — FLUTICASONE PROPIONATE 50 MCG/ACT NA SUSP
1.0000 | Freq: Every day | NASAL | Status: DC
  Administered 2014-09-02 – 2014-09-08 (×4): 1 via NASAL
  Filled 2014-08-30: qty 16

## 2014-08-30 MED ORDER — WARFARIN - PHARMACIST DOSING INPATIENT
Freq: Every day | Status: DC
  Administered 2014-09-05 – 2014-09-07 (×2)

## 2014-08-30 MED ORDER — TRAZODONE HCL 50 MG PO TABS
50.0000 mg | ORAL_TABLET | Freq: Every evening | ORAL | Status: DC | PRN
  Administered 2014-08-30: 50 mg via ORAL
  Filled 2014-08-30: qty 1

## 2014-08-30 MED ORDER — ALUM & MAG HYDROXIDE-SIMETH 200-200-20 MG/5ML PO SUSP: 30.0000 mL | ORAL | Status: DC | PRN

## 2014-08-30 MED ORDER — OXYCODONE HCL 15 MG PO TABS: 15.0000 mg | ORAL_TABLET | ORAL | Status: DC | PRN

## 2014-08-30 MED ORDER — SENNA 8.6 MG PO TABS
2.0000 | ORAL_TABLET | Freq: Two times a day (BID) | ORAL | Status: DC
  Administered 2014-08-30: 17.2 mg via ORAL
  Filled 2014-08-30 (×3): qty 2

## 2014-08-30 MED ORDER — PREGABALIN 25 MG PO CAPS
50.0000 mg | ORAL_CAPSULE | Freq: Three times a day (TID) | ORAL | Status: DC
Start: 2014-08-30 — End: 2014-09-08
  Administered 2014-08-30 – 2014-09-08 (×25): 50 mg via ORAL
  Filled 2014-08-30 (×26): qty 2

## 2014-08-30 MED ORDER — EZETIMIBE-SIMVASTATIN 10-40 MG PO TABS
1.0000 | ORAL_TABLET | Freq: Every day | ORAL | Status: DC
  Administered 2014-08-31 – 2014-09-08 (×9): 1 via ORAL
  Filled 2014-08-30 (×10): qty 1

## 2014-08-30 MED ORDER — OXYCODONE HCL 10 MG PO TABS: 10.0000 mg | ORAL_TABLET | ORAL | Status: DC | PRN

## 2014-08-30 MED ORDER — OXYCODONE HCL 5 MG PO TABS
15.0000 mg | ORAL_TABLET | ORAL | Status: DC | PRN
  Administered 2014-08-30 – 2014-09-02 (×16): 15 mg via ORAL
  Filled 2014-08-30 (×18): qty 3

## 2014-08-30 MED ORDER — AZELASTINE HCL 0.1 % NA SOLN
1.0000 | Freq: Every day | NASAL | Status: DC
  Administered 2014-09-01 – 2014-09-08 (×7): 1 via NASAL

## 2014-08-30 MED ORDER — ALBUTEROL SULFATE (2.5 MG/3ML) 0.083% IN NEBU: 2.5200 mg | INHALATION_SOLUTION | Freq: Four times a day (QID) | RESPIRATORY_TRACT | Status: DC | PRN

## 2014-08-30 MED ORDER — TRIAMTERENE-HCTZ 37.5-25 MG PO TABS
1.0000 | ORAL_TABLET | Freq: Every day | ORAL | Status: DC
  Administered 2014-08-31 – 2014-09-08 (×9): 1 via ORAL
  Filled 2014-08-30 (×10): qty 1

## 2014-08-30 MED ORDER — ACETAMINOPHEN 325 MG PO TABS: 650.0000 mg | ORAL_TABLET | Freq: Four times a day (QID) | ORAL | Status: AC | PRN

## 2014-08-30 MED ORDER — WARFARIN SODIUM 4 MG PO TABS
8.0000 mg | ORAL_TABLET | Freq: Once | ORAL | Status: AC
  Administered 2014-08-30: 8 mg via ORAL
  Filled 2014-08-30: qty 2

## 2014-08-30 MED ORDER — ACETAMINOPHEN 325 MG PO TABS
650.0000 mg | ORAL_TABLET | Freq: Four times a day (QID) | ORAL | Status: DC | PRN
  Administered 2014-09-04 – 2014-09-05 (×3): 650 mg via ORAL
  Filled 2014-08-30 (×5): qty 2

## 2014-08-30 NOTE — Progress Notes (Signed)
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  Assessment:   INR is subtherapeutic (1.49); has trended down since here.  Coumadin 7.5 mg given 11/2, then 8 mg on 11/3.  Home Coumadin regimen:  8 mg on Fri, Sat & Sundays, and 4 mg on Mon-Thursdays.  Followed by Dr. Aleatha BorerJames [facility] as outpatient.  He reports that he missed his Coumadin dose at home on 11/1,  then OR early am on 11/2.  For transfer to Rehab unit tonight.  Goal of Therapy:  INR 2-3 Monitor platelets by anticoagulation protocol: Yes   Plan:   Coumadin 8 mg again today (after transferred to Rehab).  Discussed with patient and wife.  Daily PT/INR.   Dennie FettersEgan, Wenda Vanschaick Donovan, [place] Pager: [phone] 08/30/2014,5:14 PM

## 2014-08-30 NOTE — Interval H&P Note (Signed)
Lourdes SledgeGary E Cregg was admitted today to Inpatient Rehabilitation with the diagnosis of left tibial and distal tib/fib fx's.  The patient's history has been reviewed, patient examined, and there is no change in status.  Patient continues to be appropriate for intensive inpatient rehabilitation.  I have reviewed the patient's chart and labs.  Questions were answered to the patient's satisfaction.  Vasilia Dise T 08/30/2014, 8:03 PM

## 2014-08-30 NOTE — Discharge Summary (Signed)
Physician Discharge Summary  Patient ID: Joshua SledgeGary E Fields MRN: [id number] DOB/AGE: 63/11/1950 63 y.o.  Admit date: 08/27/2014 Discharge date: 08/30/2014  Admission Diagnoses: Left closed, displaced tibia fracture; Left closed, displaced comminuted left distal fibular fracture  Discharge Diagnoses:  Active Problems:   Tibial fracture   Discharged Condition: good  Hospital Course: Mr. Joshua Fields suffered a closed injury to his left lower extremity after he fell roughly 10 feet from a tree stand.  [facility] operative fixation of his injuries via IM nailing on 08/27/14.  He tolerated the procedure well and there were no immediate post-op complications.  Initially, he struggled with pain and ambulation, but with adjustment to his pain medications, he improved throughout the week.   He was on coumadin for a previous DVT/PE and remained on his medication throughout his hospitalization. He was made WBAT in a cam walker boot and was evaluated by PT and inpatient rehab. It was recommended that he be D/C to inpatient rehab.  He has denied any new c/o HA, CP, SOB, N, V, fever, chills, calf pain or swelling.    Consults: inpatient rehab  Significant Diagnostic Studies: radiology: X-Ray: Closed left displaced tibia fracture; closed left comminuted fibular fracture  Treatments: surgery: 08/27/14  Discharge Exam: Blood pressure 112/58, pulse 72, temperature 98.9 F (37.2 C), temperature source Oral, resp. rate 16, height 5\' 7"  (1.702 m), weight 119.75 kg (264 lb), SpO2 95 %.  Physical Exam: WD/WN male in nad. A and O x4. Mood and affect appropriate. EOMI. Respirations normal and unlabored with [place] in place. Abdomen soft and non-tender. Soft dressing to LLE. Dressings C/D/I. LLE in Cam LandAmerica FinancialWalker Boot. NV intact with brisk capillary refill and 5/5 strength of toe extensors and flexors bilaterally. Distal sensation intact bilaterally. No lymphadenopathy. No calf swelling or palpable cords.    Disposition:   Discharge Instructions    Call MD / Call 911    Complete by:  As directed   If you experience chest pain or shortness of breath, CALL 911 and be transported to the hospital emergency room.  If you develope a fever above 101 F, pus (white drainage) or increased drainage or redness at the wound, or calf pain, call your surgeon's office.     Constipation Prevention    Complete by:  As directed   Drink plenty of fluids.  Prune juice may be helpful.  You may use a stool softener, such as Colace (over the counter) 100 mg twice a day.  Use MiraLax (over the counter) for constipation as needed.     Diet - low sodium heart healthy    Complete by:  As directed      Increase activity slowly as tolerated    Complete by:  As directed      Weight bearing as tolerated    Complete by:  As directed   Laterality:  left  Extremity:  Lower  In Cam boot            Medication List    STOP taking these medications        HYDROcodone-acetaminophen 10-325 MG per tablet  Commonly known as:  NORCO      TAKE these medications        acetaminophen 325 MG tablet  Commonly known as:  TYLENOL  Take 2 tablets (650 mg total) by mouth every 6 (six) hours as needed for mild pain.     albuterol 108 (90 BASE) MCG/ACT inhaler  Commonly known as:  PROVENTIL HFA;VENTOLIN HFA  Inhale 1 puff into the lungs every 6 (six) hours as needed for wheezing or shortness of breath.     DYMISTA 137-50 MCG/ACT Susp  Generic drug:  Azelastine-Fluticasone  Place 1 spray into the nose daily.     ezetimibe-simvastatin 10-40 MG per tablet  Commonly known as:  VYTORIN  Take 1 tablet by mouth daily.     glimepiride 1 MG tablet  Commonly known as:  AMARYL  Take 1 mg by mouth daily with breakfast.     methocarbamol 750 MG tablet  Commonly known as:  ROBAXIN  Take 750 mg by mouth 4 (four) times daily.     Oxycodone HCl 10 MG Tabs  Take 1 tablet (10 mg total) by mouth every 3 (three) hours as needed for  pain.     pregabalin 50 MG capsule  Commonly known as:  LYRICA  Take 50 mg by mouth 3 (three) times daily.     triamterene-hydrochlorothiazide 37.5-25 MG per capsule  Commonly known as:  DYAZIDE  Take 1 capsule by mouth daily.     warfarin 4 MG tablet  Commonly known as:  COUMADIN  - Take 4-8 mg by mouth daily at 6 PM. Takes 8 mg on Fri,Sat,Sun  - Takes 4mg  all other days           Follow-up Information    Follow up with Joshua ArthursHEWITT, JOHN, MD In 2 weeks.   Specialty:  Orthopedic Surgery   Why:  For wound re-check   Contact information:   [address] [place] [address] [phone]     -WBAT to LLE in U.[employer]. [employer]ancorpCam Walker Boot -Coumadin as prescribed for DVT prophylaxis -PO oxycodone, acetaminophen as prescribed for pain control -D/C to inpatient rehab -F/U in our clinic 2 weeks after your surgery date for first post-op visit   Signed: Ernest Orr HOWELLS 08/30/2014, 10:19 AM  [phone]

## 2014-08-30 NOTE — H&P (Signed)
Physical Medicine and Rehabilitation Admission H&P    Chief Complaint  Patient presents with  . 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 There was no loss of consciousness. INR on admission of 1.89.Cranial CT scan negative.CT abdomen and pelvis with numerous rib fractures and several compression fractures in the spine appear to be chronic and not acute in nature.X-rays and imaging showed a closed displaced left tibial shaft fracture as well as closed displaced comminuted left distal fibular fracture.Underwent open treatment of left tibial shaft fracture with intramedullary nailing closed treatment of left distal fibular fracture 08/28/2014 per Dr. Victorino Dike.Weightbearing as tolerated left lower extremity with cam boot. Hospital course pain management. Acute blood loss anemia 10.2 and monitored.Chronic Coumadin therapy resumed.Physical and occupational therapy evaluations completed 08/28/2014 with recommendations of physical medicine rehabilitation consult.  Patient was admitted for comprehensive rehabilitation program  ROS Review of Systems  Cardiovascular: Positive for palpitations.  Gastrointestinal: Positive for constipation.  Musculoskeletal: Positive for myalgias.  All other systems reviewed and are negative Past Medical History  Diagnosis Date  . Diabetes mellitus without complication   . Hypertension   . PE (pulmonary embolism)   . DVT (deep venous thrombosis)   . Hyperlipidemia    Past Surgical History  Procedure Laterality Date  . Cervical fusion  2008  . Back surgery  1982  . Tibia im nail insertion Left 08/27/2014    DR HEWITT  . Tibia im nail insertion Left 08/27/2014      Procedure: INTRAMEDULLARY (IM) NAIL TIBIAL;  Surgeon: Toni Arthurs, MD;  Location: [facility] OR;  Service: Orthopedics;  Laterality: Left;   History reviewed. No pertinent family history. Social History:  reports that he has never smoked. His smokeless tobacco use includes Snuff. He reports that he does not drink alcohol or use illicit drugs. Allergies: No Known Allergies Medications Prior to Admission  Medication Sig Dispense Refill  . albuterol (PROVENTIL HFA;VENTOLIN HFA) 108 (90 BASE) MCG/ACT inhaler Inhale 1 puff into the lungs every 6 (six) hours as needed for wheezing or shortness of breath.    . Azelastine-Fluticasone (DYMISTA) 137-50 MCG/ACT SUSP Place 1 spray into the nose daily.    Marland Kitchen ezetimibe-simvastatin (VYTORIN) 10-40 MG per tablet Take 1 tablet by mouth daily.     Marland Kitchen glimepiride (AMARYL) 1 MG tablet Take 1 mg by mouth daily with breakfast.    . HYDROcodone-acetaminophen (NORCO) 10-325 MG per tablet Take 1 tablet by mouth every 6 (six) hours as needed for moderate pain.    . methocarbamol (ROBAXIN) 750 MG tablet Take 750 mg by mouth 4 (four) times daily.    . pregabalin (LYRICA) 50 MG capsule Take 50 mg by mouth 3 (three) times daily.    Marland Kitchen triamterene-hydrochlorothiazide (DYAZIDE) 37.5-25 MG per capsule Take 1 capsule by mouth daily.    Marland Kitchen warfarin (COUMADIN) 4 MG tablet Take 4-8 mg by mouth daily at 6 PM. Takes 8 mg on Fri,Sat,Sun Takes 4mg  all other days      Home: Home Living Family/patient expects to be discharged to:: Private residence Living Arrangements: Spouse/significant other, Children Available Help at Discharge: Available 24 hours/day, Family Type of Home: House Home Access: Stairs to enter [employer] of Steps: 2 Entrance Stairs-Rails: Right  Home Layout: One level, Laundry or work area in basement Home Equipment: Gilmer Mor - single point, Environmental consultant - 2 wheels, Bedside commode, Grab bars - tub/shower   Functional History: Prior Function Level of Independence:  Independent with assistive device(s) Comments: Daughter reports pt with frequent falls due to R LE weakness.    Functional Status:  Mobility: Bed Mobility Overal bed mobility: Needs Assistance Bed Mobility: Supine to Sit Supine to sit: Mod assist General bed mobility comments: pt in recliner Transfers Overall transfer level: Needs assistance Equipment used: Rolling walker (2 wheeled) Transfers: Sit to/from Stand Sit to Stand: Mod assist, +2 physical assistance Stand pivot transfers: Total assist General transfer comment: Pt with nausea and pain at start of session. Difficulty relying on RLE to compensate for pain with LLE weightbearing in CAM boot. Pt noted to rest with R elbow on rw momentarily. Cuesfor safety with rw.      ADL: ADL Overall ADL's : Needs assistance/impaired Eating/Feeding: Set up, Sitting Grooming: Set up, Sitting Upper Body Bathing: Set up, Sitting Lower Body Bathing: +2 for physical assistance, Maximal assistance, Sit to/from stand Upper Body Dressing : Set up, Sitting Lower Body Dressing: Maximal assistance, +2 for physical assistance, Sit to/from stand Toilet Transfer Details (indicate cue type and reason): did not attempt this session, likely slide transfer to [facility] General ADL Comments: Pt at max +2 phy A level for LB ADLs. Stood for 1.5 minutes with +2 min phy A. Pt with h/o RLE weakness.   Cognition: Cognition Overall Cognitive Status: Within Functional Limits for tasks assessed Orientation Level: Oriented X4 Cognition Arousal/Alertness: Awake/alert Behavior During Therapy: WFL for tasks assessed/performed Overall Cognitive Status: Within Functional Limits for tasks assessed  Physical Exam: Blood pressure 109/60, pulse 72, temperature 98.9 F (37.2 C), temperature source Oral, resp. rate 16, height 5\' 7"  (1.702 m), weight 119.75 kg (264 lb), SpO2 95 %. Physical Exam Constitutional: He appears well-developed.  HENT: dentition poor Head:  Normocephalic.  Eyes: EOM are normal.  Neck: Normal range of motion. Neck supple. No thyromegaly present.  Cardiovascular: Normal rate and regular rhythm.  Respiratory: Effort normal and breath sounds normal. No respiratory distress.  GI: Soft. Bowel sounds are normal. He exhibits no distension.  Neurological:  Patient is appropriate/alert. Oriented 3. Follows simple commands.  CN exam intact. Motor strength is 3 left deltoid 4 left bicep tricep grip.  5 in the right deltoid, bicep, tricep, grip Right lower extremity 4/5 hip flexor and extensor ankle dorsiflexor plantar flexor Left lower extremity:  HF: 2/5, 3 minus knee, tensor ankle not tested secondary to splint Sensory intact to light touch in the hands and in the feet Skin: left leg with dressing/splint in place. Otherwise intact Psych: pleasant, generally appropriate.   Results for orders placed or performed during the hospital encounter of 08/27/14 (from the past 48 hour(s))  Glucose, capillary     Status: Abnormal   Collection Time: 08/28/14  6:48 AM  Result Value Ref Range   Glucose-Capillary 126 (H) 70 - 99 mg/dL  Glucose, capillary     Status: Abnormal   Collection Time: 08/28/14 12:21 PM  Result Value Ref Range   Glucose-Capillary 118 (H) 70 - 99 mg/dL  Glucose, capillary     Status: Abnormal   Collection Time: 08/28/14  4:26 PM  Result Value Ref Range   Glucose-Capillary 106 (H) 70 - 99 mg/dL  Glucose, capillary     Status: Abnormal   Collection Time: 08/28/14  9:31 PM  Result Value Ref  Range   Glucose-Capillary 111 (H) 70 - 99 mg/dL   Comment 1 Notify RN   Glucose, capillary     Status: Abnormal   Collection Time: 08/29/14  6:34 AM  Result Value Ref Range   Glucose-Capillary 106 (H) 70 - 99 mg/dL   Comment 1 Notify RN   Protime-INR     Status: Abnormal   Collection Time: 08/29/14  6:47 AM  Result Value Ref Range   Prothrombin Time 19.3 (H) 11.6 - 15.2 seconds   INR 1.61 (H) 0.00 - 1.49  Glucose, capillary      Status: Abnormal   Collection Time: 08/29/14 12:19 PM  Result Value Ref Range   Glucose-Capillary 120 (H) 70 - 99 mg/dL  Glucose, capillary     Status: None   Collection Time: 08/29/14  4:46 PM  Result Value Ref Range   Glucose-Capillary 85 70 - 99 mg/dL  Glucose, capillary     Status: Abnormal   Collection Time: 08/29/14  9:10 PM  Result Value Ref Range   Glucose-Capillary 119 (H) 70 - 99 mg/dL  Protime-INR     Status: Abnormal   Collection Time: 08/30/14  5:24 AM  Result Value Ref Range   Prothrombin Time 18.1 (H) 11.6 - 15.2 seconds   INR 1.49 0.00 - 1.49  CBC     Status: Abnormal   Collection Time: 08/30/14  5:24 AM  Result Value Ref Range   WBC 5.1 4.0 - 10.5 K/uL   RBC 3.17 (L) 4.22 - 5.81 MIL/uL   Hemoglobin 10.2 (L) 13.0 - 17.0 g/dL   HCT 16.131.0 (L) 09.639.0 - 04.552.0 %   MCV 97.8 78.0 - 100.0 fL   MCH 32.2 26.0 - 34.0 pg   MCHC 32.9 30.0 - 36.0 g/dL   RDW 40.913.6 81.111.5 - 91.415.5 %   Platelets 146 (L) 150 - 400 K/uL  Glucose, capillary     Status: Abnormal   Collection Time: 08/30/14  6:19 AM  Result Value Ref Range   Glucose-Capillary 142 (H) 70 - 99 mg/dL   No results found.     Medical Problem List and Plan: 1. Functional deficits secondary to Multitrauma after fall. Closed displaced left tibial shaft fracture with displaced comminuted left distal fibula fracture. Status post IM nailing. Weightbearing as tolerated 2.  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 Monitor for bleeding episodes 3. Pain Management: Lyrica 50 mg 3 times a day, oxycodone as needed. Monitor with increased mobility 4. Mood: generally appropriate. Team to provide ego support as possible 5. Neuropsych: This patient is  capable of making decisions on his own behalf. 6. Skin/Wound Care: routine skin checks 7. Fluids/Electrolytes/Nutrition: Strict INO's. Follow-up chemistries. Provide nutritional supplements as needed 8. Diabetes mellitus with  peripheral neuropathy. Amaryl 1 mg daily. Check blood sugar before meals and at bedtime. 9.Hypertension. Maxide 37.5-25 mg  Daily. Monitor with increased mobility 10.Hyperlipidemia. Continue Vytorin   Post Admission Physician Evaluation: 1. Functional deficits secondary  to left tibial shaft/ comminuted distal left tib-fib fx after fall. 2. Patient is admitted to receive collaborative, interdisciplinary care between the physiatrist, rehab nursing staff, and therapy team. 3. Patient's level of medical complexity and substantial therapy needs in context of that medical necessity cannot be provided at a lesser intensity of care such as a SNF. 4. Patient has experienced substantial functional loss from his/her baseline which was documented above under the "Functional History" and "Functional Status" headings.  Judging by the patient's diagnosis, physical exam,  and functional history, the patient has potential for functional progress which will result in measurable gains while on inpatient rehab.  These gains will be of substantial and practical use upon discharge  in facilitating mobility and self-care at the household level. 5. Physiatrist will provide 24 hour management of medical needs as well as oversight of the therapy plan/treatment and provide guidance as appropriate regarding the interaction of the two. 6. 24 hour rehab nursing will assist with bladder management, bowel management, safety, skin/wound care, disease management, medication administration, pain management and patient education  and help integrate therapy concepts, techniques,education, etc. 7. PT will assess and treat for/with: Lower extremity strength, range of motion, stamina, balance, functional mobility, safety, adaptive techniques and equipment, pain mgt, ortho precautions.   Goals are: mod I. 8. OT will assess and treat for/with: ADL's, functional mobility, safety, upper extremity strength, adaptive techniques and equipment, ortho  precautions, leisure awareness, community reintegration.   Goals are: mod I to supervision. Therapy may not proceed with showering this patient. 9. SLP will assess and treat for/with: n/a.  Goals are: n/a. 10. Case Management and Social Worker will assess and treat for psychological issues and discharge planning. 11. Team conference will be held weekly to assess progress toward goals and to determine barriers to discharge. 12. Patient will receive at least 3 hours of therapy per day at least 5 days per week. 13. ELOS: 10-12 days       14. Prognosis:  excellent     Ranelle OysterZachary T. Joya Willmott, MD, [facility] [place] [facility] 08/30/2014   08/30/2014

## 2014-08-30 NOTE — Progress Notes (Signed)
Physical Therapy Treatment Patient Details Name: Joshua SledgeGary E Fields MRN: [id number] DOB: 04/14/1951 Today's Date: 08/30/2014    History of Present Illness 63 y/o male with PMH of PE and diabetes c/o left leg pain since falling from a tree stand earlier this afternoon.  He fell about 10 feet landing on the left leg only.  He denies any pain elsewhere - specifically his lower back and feet.  He denies any LOC.  Closed displaced left tibial shaft fracture and Closed displaced comminuted left distal fibula fracture . Pt is s/p Open treatment of left tibial shaft fracture with intramedullary nailing and Closed treatment of left distal fibula fracture.    PT Comments    Pt very motivated today to progress mobility. Able to ambulate 10' today with 2 person (A) for safety and balance. Pt tolerating OOB for incr time today. Will benefit from CIR to maximize independence prior to returning home with wife.   Follow Up Recommendations  CIR     Equipment Recommendations  3in1 (PT)    Recommendations for Other Services Rehab consult     Precautions / Restrictions Precautions Precautions: Fall Required Braces or Orthoses: Other Brace/Splint Other Brace/Splint: CAM boot when walking Restrictions Weight Bearing Restrictions: Yes LLE Weight Bearing: Weight bearing as tolerated Other Position/Activity Restrictions: with CAM boot (per OP REPORT)    Mobility  Bed Mobility               General bed mobility comments: not addressed; pt up in chair and returned to chair   Transfers Overall transfer level: Needs assistance Equipment used: Rolling walker (2 wheeled) Transfers: Sit to/from Stand Sit to Stand: Mod assist         General transfer comment: cues for hand placement and sequencing; incr time due to pain ; pushing up primarily through Rt LE due to incr pain with WB on Lt LE ; (A) to facilitate placement of lt LE when lowering to sitting position   Ambulation/Gait Ambulation/Gait  assistance: +2 safety/equipment;Mod assist Ambulation Distance (Feet): 10 Feet Assistive device: Rolling walker (2 wheeled) Gait Pattern/deviations: Step-to pattern;Decreased stance time - left;Decreased step length - right;Trunk flexed;Antalgic Gait velocity: decreased due to pain  Gait velocity interpretation: <1.8 ft/sec, indicative of risk for recurrent falls General Gait Details: cues for upright posture and gt sequencing; 2nd person for safety to follow with chair; pt limited by fatigue and pain; relying heavivly on UEs to relieve WB off Lt LE    Stairs            Wheelchair Mobility    Modified Rankin (Stroke Patients Only)       Balance Overall balance assessment: Needs assistance         Standing balance support: During functional activity;Bilateral upper extremity supported Standing balance-Leahy Scale: Poor Standing balance comment: pt relying heavily on RW for bil UE support                    Cognition Arousal/Alertness: Awake/alert Behavior During Therapy: WFL for tasks assessed/performed Overall Cognitive Status: Within Functional Limits for tasks assessed                      Exercises      General Comments        Pertinent Vitals/Pain Pain Assessment: 0-10 Pain Score: 9  Pain Location: Lt LE  Pain Descriptors / Indicators: Stabbing;Constant ("knife like") Pain Intervention(s): Monitored during session;Premedicated before session;Repositioned    Home Living  Prior Function            PT Goals (current goals can now be found in the care plan section) Acute Rehab PT Goals Patient Stated Goal:  (to not have pain) PT Goal Formulation: With patient/family Time For Goal Achievement: 09/11/14 Potential to Achieve Goals: Good Progress towards PT goals: Progressing toward goals    Frequency  Min 5X/week    PT Plan Current plan remains appropriate    Co-evaluation             End of  Session Equipment Utilized During Treatment: Gait belt;Oxygen Activity Tolerance: Patient limited by fatigue;Patient limited by pain Patient left: in chair;with family/visitor present     Time: [id number] PT Time Calculation (min): 16 min  Charges:  $Gait Training: 8-22 mins                    G Codes:      Donnamarie PoagWest, Divine Hansley Sarah AnnN, [place] CarolinaPT  [phone] 08/30/2014, 10:12 AM

## 2014-08-30 NOTE — Progress Notes (Signed)
Subjective: 3 Days Post-Op Procedure(s) (LRB): INTRAMEDULLARY (IM) NAIL TIBIAL (Left) Patient reports pain as moderate.  His pain is improving, and is controlled well with his medications. He denies any new c/o HA, CP, SOB, N, V, fever, chills, calf pain or swelling. He understands that D/C plans may include inpatient rehab or a SNF if he continues to have difficulty with ambulation. He has not been up with PT yet, but is scheduled to do so this morning.   Objective: Vital signs in last 24 hours: Temp:  [98.7 F (37.1 C)-98.9 F (37.2 C)] 98.9 F (37.2 C) (11/04 0520) Pulse Rate:  [72-90] 72 (11/04 0520) Resp:  [16] 16 (11/04 0520) BP: (103-109)/(60) 109/60 mmHg (11/04 0520) SpO2:  [89 %-95 %] 95 % (11/04 0520)  Intake/Output from previous day: 11/03 0701 - 11/04 0700 In: 2590 [P.O.:790; I.V.:1800] Out: -  Intake/Output this shift:     Recent Labs  08/27/14 1618 08/28/14 0533 08/30/14 0524  HGB 12.7* 11.8* 10.2*    Recent Labs  08/28/14 0533 08/30/14 0524  WBC 7.0 5.1  RBC 3.67* 3.17*  HCT 35.3* 31.0*  PLT 165 146*    Recent Labs  08/27/14 1618  NA 139  K 4.4  CL 100  CO2 27  BUN 18  CREATININE 0.78  GLUCOSE 108*  CALCIUM 8.9    Recent Labs  08/29/14 0647 08/30/14 0524  INR 1.61* 1.49   Physical Exam: WD/WN male in nad. A and O x4. Mood and affect appropriate. EOMI. Respirations normal and unlabored with [place] in place. Abdomen soft and non-tender. Soft dressing to LLE. Dressings C/D/I. LLE in Cam Land[employer] FinancialWalker Boot. NV intact with brisk capillary refill and 5/5 strength of toe extensors and flexors bilaterally. Distal sensation intact bilaterally. No lymphadenopathy. No calf swelling or palpable cords.   Assessment/Plan: 3 Days Post-Op Procedure(s) (LRB): INTRAMEDULLARY (IM) NAIL TIBIAL (Left) Up with therapy; WBAT LLE in Cam Boot Consult for inpatient rehab pending; depending on how well he does with PT this morning, may D/C to inpt rehab Continue pain meds  for pain control; wean to PO only as soon as possible  Continue coumadin for DVT prophylaxis  D/C plan pending inpt rehab consult  Joshua Fields 08/30/2014, 8:16 AM  [phone]

## 2014-08-30 NOTE — Discharge Instructions (Signed)
[name], MD [facility]  Please read the following information regarding your care after surgery.  Medications  You only need a prescription for the narcotic pain medicine (ex. oxycodone, Percocet, Norco).  All of the other medicines listed below are available over the counter. X acetominophen (Tylenol) 650 mg every 4-6 hours as you need for minor pain X oxycodone as prescribed for moderate to severe pain   Narcotic pain medicine (ex. oxycodone, Percocet, Vicodin) will cause constipation.  To prevent this problem, take the following medicines while you are taking any pain medicine. X docusate sodium (Colace) 100 mg twice a day X senna (Senokot) 2 tablets twice a day  X To help prevent blood clots, take your coumadin as prescribed   Weight Bearing X Bear weight when you are able on your operated leg or foot in a Cam Boot.  Cast / Splint / Dressing X Keep your dressings clean and dry.  Dont put anything (coat hanger, pencil, etc) down inside of it.  If it gets damp, use a hair dryer on the cool setting to dry it.  If it gets soaked, call the office to schedule an appointment for a cast change.     After your dressing, cast or splint is removed; you may shower, but do not soak or scrub the wound.  Allow the water to run over it, and then gently pat it dry.  Swelling It is normal for you to have swelling where you had surgery.  To reduce swelling and pain, keep your toes above your nose for at least 3 days after surgery.  It may be necessary to keep your foot or leg elevated for several weeks.  If it hurts, it should be elevated.  Follow Up Call my office at [phone] when you are discharged from the hospital or surgery center to schedule an appointment to be seen two weeks after surgery.  Call my office at [phone] if you develop a fever >101.5 F, nausea, vomiting, bleeding from the surgical site or severe pain.

## 2014-08-30 NOTE — Progress Notes (Signed)
Inpatient Rehabilitation  I was able to observe pt's PT session this am and he has improved tolerance to therapy today.  I will plan to admit him to IP rehab today.  Have cleared this with Guy SandiferJackie Allen, Ortho PA .  Also discussed with pt's RN Dedra SkeensGwen and with CM Vance PeperSusan Brady.  Please call if questions.  Weldon PickingSusan Inella Kuwahara PT Inpatient Rehab Admissions Coordinator Cell [phone] Office [phone]

## 2014-08-30 NOTE — H&P (View-Only) (Signed)
Physical Medicine and Rehabilitation Admission H&P    Chief Complaint  Patient presents with  . 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 There was no loss of consciousness. INR on admission of 1.89.Cranial CT scan negative.CT abdomen and pelvis with numerous rib fractures and several compression fractures in the spine appear to be chronic and not acute in nature.X-rays and imaging showed a closed displaced left tibial shaft fracture as well as closed displaced comminuted left distal fibular fracture.Underwent open treatment of left tibial shaft fracture with intramedullary nailing closed treatment of left distal fibular fracture 08/28/2014 per Dr. Victorino Dike.Weightbearing as tolerated left lower extremity with cam boot. Hospital course pain management. Acute blood loss anemia 10.2 and monitored.Chronic Coumadin therapy resumed.Physical and occupational therapy evaluations completed 08/28/2014 with recommendations of physical medicine rehabilitation consult.  Patient was admitted for comprehensive rehabilitation program  ROS Review of Systems  Cardiovascular: Positive for palpitations.  Gastrointestinal: Positive for constipation.  Musculoskeletal: Positive for myalgias.  All other systems reviewed and are negative Past Medical History  Diagnosis Date  . Diabetes mellitus without complication   . Hypertension   . PE (pulmonary embolism)   . DVT (deep venous thrombosis)   . Hyperlipidemia    Past Surgical History  Procedure Laterality Date  . Cervical fusion  2008  . Back surgery  1982  . Tibia im nail insertion Left 08/27/2014    DR HEWITT  . Tibia im nail insertion Left 08/27/2014      Procedure: INTRAMEDULLARY (IM) NAIL TIBIAL;  Surgeon: Toni Arthurs, MD;  Location: [facility] OR;  Service: Orthopedics;  Laterality: Left;   History reviewed. No pertinent family history. Social History:  reports that he has never smoked. His smokeless tobacco use includes Snuff. He reports that he does not drink alcohol or use illicit drugs. Allergies: No Known Allergies Medications Prior to Admission  Medication Sig Dispense Refill  . albuterol (PROVENTIL HFA;VENTOLIN HFA) 108 (90 BASE) MCG/ACT inhaler Inhale 1 puff into the lungs every 6 (six) hours as needed for wheezing or shortness of breath.    . Azelastine-Fluticasone (DYMISTA) 137-50 MCG/ACT SUSP Place 1 spray into the nose daily.    Marland Kitchen ezetimibe-simvastatin (VYTORIN) 10-40 MG per tablet Take 1 tablet by mouth daily.     Marland Kitchen glimepiride (AMARYL) 1 MG tablet Take 1 mg by mouth daily with breakfast.    . HYDROcodone-acetaminophen (NORCO) 10-325 MG per tablet Take 1 tablet by mouth every 6 (six) hours as needed for moderate pain.    . methocarbamol (ROBAXIN) 750 MG tablet Take 750 mg by mouth 4 (four) times daily.    . pregabalin (LYRICA) 50 MG capsule Take 50 mg by mouth 3 (three) times daily.    Marland Kitchen triamterene-hydrochlorothiazide (DYAZIDE) 37.5-25 MG per capsule Take 1 capsule by mouth daily.    Marland Kitchen warfarin (COUMADIN) 4 MG tablet Take 4-8 mg by mouth daily at 6 PM. Takes 8 mg on Fri,Sat,Sun Takes 4mg  all other days      Home: Home Living Family/patient expects to be discharged to:: Private residence Living Arrangements: Spouse/significant other, Children Available Help at Discharge: Available 24 hours/day, Family Type of Home: House Home Access: Stairs to enter [employer] of Steps: 2 Entrance Stairs-Rails: Right  Home Layout: One level, Laundry or work area in basement Home Equipment: Gilmer Mor - single point, Environmental consultant - 2 wheels, Bedside commode, Grab bars - tub/shower   Functional History: Prior Function Level of Independence:  Independent with assistive device(s) Comments: Daughter reports pt with frequent falls due to R LE weakness.    Functional Status:  Mobility: Bed Mobility Overal bed mobility: Needs Assistance Bed Mobility: Supine to Sit Supine to sit: Mod assist General bed mobility comments: pt in recliner Transfers Overall transfer level: Needs assistance Equipment used: Rolling walker (2 wheeled) Transfers: Sit to/from Stand Sit to Stand: Mod assist, +2 physical assistance Stand pivot transfers: Total assist General transfer comment: Pt with nausea and pain at start of session. Difficulty relying on RLE to compensate for pain with LLE weightbearing in CAM boot. Pt noted to rest with R elbow on rw momentarily. Cuesfor safety with rw.      ADL: ADL Overall ADL's : Needs assistance/impaired Eating/Feeding: Set up, Sitting Grooming: Set up, Sitting Upper Body Bathing: Set up, Sitting Lower Body Bathing: +2 for physical assistance, Maximal assistance, Sit to/from stand Upper Body Dressing : Set up, Sitting Lower Body Dressing: Maximal assistance, +2 for physical assistance, Sit to/from stand Toilet Transfer Details (indicate cue type and reason): did not attempt this session, likely slide transfer to [facility] ADL Comments: Pt at max +2 phy A level for LB ADLs. Stood for 1.5 minutes with +2 min phy A. Pt with h/o RLE weakness.   Cognition: Cognition Overall Cognitive Status: Within Functional Limits for tasks assessed Orientation Level: Oriented X4 Cognition Arousal/Alertness: Awake/alert Behavior During Therapy: WFL for tasks assessed/performed Overall Cognitive Status: Within Functional Limits for tasks assessed  Physical Exam: Blood pressure 109/60, pulse 72, temperature 98.9 F (37.2 C), temperature source Oral, resp. rate 16, height 5\' 7"  (1.702 m), weight 119.75 kg (264 lb), SpO2 95 %. Physical Exam Constitutional: He appears well-developed.  HENT: dentition poor Head:  Normocephalic.  Eyes: EOM are normal.  Neck: Normal range of motion. Neck supple. No thyromegaly present.  Cardiovascular: Normal rate and regular rhythm.  Respiratory: Effort normal and breath sounds normal. No respiratory distress.  GI: Soft. Bowel sounds are normal. He exhibits no distension.  Neurological:  Patient is appropriate/alert. Oriented 3. Follows simple commands.  CN exam intact. Motor strength is 3 left deltoid 4 left bicep tricep grip.  5 in the right deltoid, bicep, tricep, grip Right lower extremity 4/5 hip flexor and extensor ankle dorsiflexor plantar flexor Left lower extremity:  HF: 2/5, 3 minus knee, tensor ankle not tested secondary to splint Sensory intact to light touch in the hands and in the feet Skin: left leg with dressing/splint in place. Otherwise intact Psych: pleasant, generally appropriate.   Results for orders placed or performed during the hospital encounter of 08/27/14 (from the past 48 hour(s))  Glucose, capillary     Status: Abnormal   Collection Time: 08/28/14  6:48 AM  Result Value Ref Range   Glucose-Capillary 126 (H) 70 - 99 mg/dL  Glucose, capillary     Status: Abnormal   Collection Time: 08/28/14 12:21 PM  Result Value Ref Range   Glucose-Capillary 118 (H) 70 - 99 mg/dL  Glucose, capillary     Status: Abnormal   Collection Time: 08/28/14  4:26 PM  Result Value Ref Range   Glucose-Capillary 106 (H) 70 - 99 mg/dL  Glucose, capillary     Status: Abnormal   Collection Time: 08/28/14  9:31 PM  Result Value Ref  Range   Glucose-Capillary 111 (H) 70 - 99 mg/dL   Comment 1 Notify RN   Glucose, capillary     Status: Abnormal   Collection Time: 08/29/14  6:34 AM  Result Value Ref Range   Glucose-Capillary 106 (H) 70 - 99 mg/dL   Comment 1 Notify RN   Protime-INR     Status: Abnormal   Collection Time: 08/29/14  6:47 AM  Result Value Ref Range   Prothrombin Time 19.3 (H) 11.6 - 15.2 seconds   INR 1.61 (H) 0.00 - 1.49  Glucose, capillary      Status: Abnormal   Collection Time: 08/29/14 12:19 PM  Result Value Ref Range   Glucose-Capillary 120 (H) 70 - 99 mg/dL  Glucose, capillary     Status: None   Collection Time: 08/29/14  4:46 PM  Result Value Ref Range   Glucose-Capillary 85 70 - 99 mg/dL  Glucose, capillary     Status: Abnormal   Collection Time: 08/29/14  9:10 PM  Result Value Ref Range   Glucose-Capillary 119 (H) 70 - 99 mg/dL  Protime-INR     Status: Abnormal   Collection Time: 08/30/14  5:24 AM  Result Value Ref Range   Prothrombin Time 18.1 (H) 11.6 - 15.2 seconds   INR 1.49 0.00 - 1.49  CBC     Status: Abnormal   Collection Time: 08/30/14  5:24 AM  Result Value Ref Range   WBC 5.1 4.0 - 10.5 K/uL   RBC 3.17 (L) 4.22 - 5.81 MIL/uL   Hemoglobin 10.2 (L) 13.0 - 17.0 g/dL   HCT 16.131.0 (L) 09.639.0 - 04.552.0 %   MCV 97.8 78.0 - 100.0 fL   MCH 32.2 26.0 - 34.0 pg   MCHC 32.9 30.0 - 36.0 g/dL   RDW 40.913.6 81.111.5 - 91.415.5 %   Platelets 146 (L) 150 - 400 K/uL  Glucose, capillary     Status: Abnormal   Collection Time: 08/30/14  6:19 AM  Result Value Ref Range   Glucose-Capillary 142 (H) 70 - 99 mg/dL   No results found.     Medical Problem List and Plan: 1. Functional deficits secondary to Multitrauma after fall. Closed displaced left tibial shaft fracture with displaced comminuted left distal fibula fracture. Status post IM nailing. Weightbearing as tolerated 2.  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 Monitor for bleeding episodes 3. Pain Management: Lyrica 50 mg 3 times a day, oxycodone as needed. Monitor with increased mobility 4. Mood: generally appropriate. Team to provide ego support as possible 5. Neuropsych: This patient is  capable of making decisions on his own behalf. 6. Skin/Wound Care: routine skin checks 7. Fluids/Electrolytes/Nutrition: Strict INO's. Follow-up chemistries. Provide nutritional supplements as needed 8. Diabetes mellitus with  peripheral neuropathy. Amaryl 1 mg daily. Check blood sugar before meals and at bedtime. 9.Hypertension. Maxide 37.5-25 mg  Daily. Monitor with increased mobility 10.Hyperlipidemia. Continue Vytorin   Post Admission Physician Evaluation: 1. Functional deficits secondary  to left tibial shaft/ comminuted distal left tib-fib fx after fall. 2. Patient is admitted to receive collaborative, interdisciplinary care between the physiatrist, rehab nursing staff, and therapy team. 3. Patient's level of medical complexity and substantial therapy needs in context of that medical necessity cannot be provided at a lesser intensity of care such as a SNF. 4. Patient has experienced substantial functional loss from his/her baseline which was documented above under the "Functional History" and "Functional Status" headings.  Judging by the patient's diagnosis, physical exam,  and functional history, the patient has potential for functional progress which will result in measurable gains while on inpatient rehab.  These gains will be of substantial and practical use upon discharge  in facilitating mobility and self-care at the household level. 5. Physiatrist will provide 24 hour management of medical needs as well as oversight of the therapy plan/treatment and provide guidance as appropriate regarding the interaction of the two. 6. 24 hour rehab nursing will assist with bladder management, bowel management, safety, skin/wound care, disease management, medication administration, pain management and patient education  and help integrate therapy concepts, techniques,education, etc. 7. PT will assess and treat for/with: Lower extremity strength, range of motion, stamina, balance, functional mobility, safety, adaptive techniques and equipment, pain mgt, ortho precautions.   Goals are: mod I. 8. OT will assess and treat for/with: ADL's, functional mobility, safety, upper extremity strength, adaptive techniques and equipment, ortho  precautions, leisure awareness, community reintegration.   Goals are: mod I to supervision. Therapy may not proceed with showering this patient. 9. SLP will assess and treat for/with: n/a.  Goals are: n/a. 10. Case Management and Social Worker will assess and treat for psychological issues and discharge planning. 11. Team conference will be held weekly to assess progress toward goals and to determine barriers to discharge. 12. Patient will receive at least 3 hours of therapy per day at least 5 days per week. 13. ELOS: 10-12 days       14. Prognosis:  excellent     Ranelle OysterZachary T. Siddhartha Hoback, MD, [facility] [place] [facility] 08/30/2014   08/30/2014

## 2014-08-30 NOTE — Plan of Care (Signed)
Problem: Consults Goal: TIB FIB Fracture Patient Education See Patient Education Module for education specifics.  Outcome: Completed/Met Date Met:  08/30/14 Goal: Diabetes Guidelines if Diabetic/Glucose > 140 If diabetic or lab glucose is > 140 mg/dl - Initiate Diabetes/Hyperglycemia Guidelines & Document Interventions  Outcome: Progressing  Problem: Discharge Progression Outcomes Goal: Pain controlled with appropriate interventions Outcome: Progressing Goal: CMS at or above baseline Outcome: Progressing Goal: Dressing/splint clean, dry, intact, no S/S of infection Outcome: Progressing Goal: Tolerates diet Outcome: Completed/Met Date Met:  08/30/14

## 2014-08-30 NOTE — Progress Notes (Signed)
Weldon PickingBLANKENSHIP, Danel Requena Rehab Admission Coordinator Signed Physical Medicine and Rehabilitation PMR Pre-admission 08/30/2014 11:32 AM  Related encounter: ED to Hosp-Admission (Current) [facility]    Expand All Collapse All   PMR Admission Coordinator Pre-Admission Assessment  Patient: Joshua Fields is an 63 y.o., male MRN: [id number] DOB: 02/11/1951 Height: 5\' 7"  (170.2 cm) Weight: 119.75 kg (264 lb)  Insurance Information HMO: PPO: PCP: IPA: 80/20: OTHER:  PRIMARY: Medicare A and B Policy#: [id number] Subscriber: self CM Name: Phone#: Fax#:  Pre-Cert#: Employer: retired Benefits: Phone #: Name:  Eff. Date: Part A 05/27/05; Part B 11/27/05 Deduct: $1260 Out of Pocket Max: no  Life Max: no CIR: 100% SNF: 100% first 20 days Outpatient: 80% Co-Pay: 20% Home Health: 100% Co-Pay: no DME: 80% Co-Pay: 20% Providers: Pt. choice SECONDARY: [employer] Policy#: [id number] Subscriber: self   Emergency Contact Information Contact Information    Name Relation Home Work [employer]   Scheid,WANDA  [phone]     PierceHollins,Tabatha Daughter [phone]     [place],Melissa Daughter [phone]       Current Medical History  Patient Admitting Diagnosis: Multitrauma after fall status post IM nail tibial fracture History of Present Illness: Joshua SledgeGary E Fields is a 63 y.o.right handed male with history of diabetes mellitus peripheral neuropathy, hypertension, pulmonary emboli with DVT maintained on chronic Coumadin.Independent prior to admission living with his wife.Admitted 11/01/2015after a fall from a tree approximately 10 feet landing on left leg. There was no  loss of consciousness. INR on admission of 1.89.Cranial CT scan negative.CT abdomen and pelvis with numerous rib fractures and several compression fractures in the spine appear to be chronic and not acute in nature.X-rays and imaging showed a closed displaced left tibial shaft fracture as well as closed displaced comminuted left distal fibular fracture.Underwent open treatment of left tibial shaft fracture with intramedullary nailing closed treatment of left distal fibular fracture 08/28/2014 per Dr. Victorino DikeHewitt.Weightbearing as tolerated left lower extremity with cam boot. Hospital course pain management. Acute blood loss anemia 10.2 and monitored.Chronic Coumadin therapy resumed.Physical and occupational therapy evaluations completed 08/28/2014 with recommendations of physical medicine rehabilitation consult. Patient was admitted for comprehensive rehabilitation program      Past Medical History  Past Medical History  Diagnosis Date  . Diabetes mellitus without complication   . Hypertension   . PE (pulmonary embolism)   . DVT (deep venous thrombosis)   . Hyperlipidemia     Family History  family history is not on file.  Prior Rehab/Hospitalizations: [facility], about 2 years ago; pt. Unsure if SNF or CIR  Current Medications  Current facility-administered medications: 0.9 % sodium chloride infusion, , Intravenous, Continuous, [name], MD, Last Rate: 75 mL/hr at 08/29/14 0243; acetaminophen (TYLENOL) tablet 650 mg, 650 mg, Oral, Q6H PRN, Remo LippsJacquelyn Howells Allen, PA, 650 mg at 08/28/14 1624; albuterol (PROVENTIL) (2.5 MG/3ML) 0.083% nebulizer solution 2.52 mg, 2.52 mg, Inhalation, Q6H PRN, [name], MD azelastine (ASTELIN) 0.1 % nasal spray 1 spray, 1 spray, Each Nare, Daily, 1 spray at 08/28/14 1117 **AND** fluticasone (FLONASE) 50 MCG/ACT nasal spray 1 spray, 1 spray, Each Nare, Daily, [name], MD, 1 spray at 08/29/14 1100; docusate sodium (COLACE) capsule 100 mg,  100 mg, Oral, BID, [name], MD, 100 mg at 08/30/14 [id number] ezetimibe-simvastatin (VYTORIN) 10-40 MG per tablet 1 tablet, 1 tablet, Oral, Daily, [name], MD, 1 tablet at 08/30/14 0922; glimepiride (AMARYL) tablet 1 mg, 1 mg, Oral, Q breakfast,  Toni Arthurs, MD, 1 mg at 08/30/14 1914; HYDROmorphone (DILAUDID) injection 0.5-1 mg, 0.5-1 mg, Intravenous, Q1H PRN, Remo Lipps, PA, 1 mg at 08/30/14 0331 insulin aspart (novoLOG) injection 0-15 Units, 0-15 Units, Subcutaneous, TID WC, Toni Arthurs, MD, 2 Units at 08/30/14 [phone]; metoCLOPramide (REGLAN) tablet 5-10 mg, 5-10 mg, Oral, Q8H PRN **OR** metoCLOPramide (REGLAN) injection 5-10 mg, 5-10 mg, Intravenous, Q8H PRN, Toni Arthurs, MD; ondansetron (ZOFRAN) tablet 4 mg, 4 mg, Oral, Q6H PRN **OR** ondansetron (ZOFRAN) injection 4 mg, 4 mg, Intravenous, Q6H PRN, Toni Arthurs, MD, 4 mg at 08/30/14 5621 oxyCODONE (Oxy IR/ROXICODONE) immediate release tablet 15 mg, 15 mg, Oral, Q3H PRN, Remo Lipps, PA, 15 mg at 08/30/14 0942; pneumococcal 23 valent vaccine (PNU-IMMUNE) injection 0.5 mL, 0.5 mL, Intramuscular, Tomorrow-1000, Toni Arthurs, MD; pregabalin (LYRICA) capsule 50 mg, 50 mg, Oral, TID, Toni Arthurs, MD, 50 mg at 08/30/14 3086; senna (SENOKOT) tablet 17.2 mg, 2 tablet, Oral, BID, Toni Arthurs, MD, 17.2 mg at 08/30/14 5784 triamterene-hydrochlorothiazide (MAXZIDE-25) 37.5-25 MG per tablet 1 tablet, 1 tablet, Oral, Daily, Toni Arthurs, MD, 1 tablet at 08/30/14 [phone]; Warfarin - Pharmacist Dosing Inpatient, , Does not apply, q1800, Abran Duke, RPH  Patients Current Diet: Diet Carb Modified Diet - low sodium heart healthy  Precautions / Restrictions Precautions Precautions: Fall Other Brace/Splint: CAM boot when walking Restrictions Weight Bearing Restrictions: Yes LLE Weight Bearing: Weight bearing as tolerated Other Position/Activity Restrictions: with CAM boot (per OP REPORT)   Prior Activity Level Community (5-7x/wk): Pt.  is out in community frequently grocery shopping, running errands and deer hunting.  Home Assistive Devices / Equipment Home Assistive Devices/Equipment: None Home Equipment: Cane - single point, Environmental consultant - 2 wheels, Bedside commode, Grab bars - tub/shower  Prior Functional Level Prior Function Level of Independence: Independent with assistive device(s) Comments: Daughter reports pt with frequent falls due to R LE weakness.   Current Functional Level Cognition  Overall Cognitive Status: Within Functional Limits for tasks assessed Orientation Level: Oriented X4   Extremity Assessment (includes Sensation/Coordination)          ADLs  Overall ADL's : Needs assistance/impaired Eating/Feeding: Set up, Sitting Grooming: Set up, Sitting Upper Body Bathing: Set up, Sitting Lower Body Bathing: +2 for physical assistance, Maximal assistance, Sit to/from stand Upper Body Dressing : Set up, Sitting Lower Body Dressing: Maximal assistance, +2 for physical assistance, Sit to/from stand Toilet Transfer Details (indicate cue type and reason): did not attempt this session, likely slide transfer to [facility] General ADL Comments: Pt at max +2 phy A level for LB ADLs. Stood for 1.5 minutes with +2 min phy A. Pt with h/o RLE weakness.     Mobility  Overal bed mobility: Needs Assistance Bed Mobility: Supine to Sit Supine to sit: Mod assist General bed mobility comments: not addressed; pt up in chair and returned to chair     Transfers  Overall transfer level: Needs assistance Equipment used: Rolling walker (2 wheeled) Transfers: Sit to/from Stand Sit to Stand: Mod assist Stand pivot transfers: Total assist General transfer comment: cues for hand placement and sequencing; incr time due to pain ; pushing up primarily through Rt LE due to incr pain with WB on Lt LE ; (A) to facilitate placement of lt LE when lowering to sitting position     Ambulation / Gait / Stairs / Wheelchair  Mobility  Ambulation/Gait Ambulation/Gait assistance: +2 safety/equipment, Mod assist Ambulation Distance (Feet): 10 Feet Assistive device: Rolling walker (2 wheeled) Gait Pattern/deviations: Step-to pattern, Decreased  stance time - left, Decreased step length - right, Trunk flexed, Antalgic Gait velocity: decreased due to pain  Gait velocity interpretation: <1.8 ft/sec, indicative of risk for recurrent falls General Gait Details: cues for upright posture and gt sequencing; 2nd person for safety to follow with chair; pt limited by fatigue and pain; relying heavivly on UEs to relieve WB off Lt LE     Posture / Balance      Special needs/care consideration BiPAP/CPAP no Continuous Drip IV NaCl 75 ml/hr  Oxygen Yes currently at 3L/min; does not use O2 at home  Skin No issues per pt.  Bowel mgmt: Episode of bowel incontinence 08/30/14; had been continent prior to this Bladder mgmt: continent , using urinal Diabetic mgmt yes     Previous Home Environment Living Arrangements: Spouse/significant other, Children Available Help at Discharge: Available 24 hours/day, Family Type of Home: House Home Layout: One level, Laundry or work area in basement Home Access: Stairs to enter Entrance Stairs-Rails: Engineer, manufacturingight Entrance Stairs-Number of Steps: 2 Bathroom Shower/Tub: Health visitorWalk-in shower Bathroom Toilet: Standard Home Care Services: No  Discharge Living Setting Plans for Discharge Living Setting: Patient's home Type of Home at Discharge: House Discharge Home Layout: Two level, Laundry or work area in basement (pt. reports he lives in the basement in "man cave") Discharge Home Access: Level entry (level entry into basement ) Discharge Bathroom Shower/Tub: Tub/shower unit Discharge Bathroom Toilet: Standard Discharge Bathroom Accessibility: Yes How Accessible: Accessible via walker Does the patient have any problems obtaining your  medications?: No  Social/Family/Support Systems Patient Roles: Spouse, Parent Anticipated Caregiver: Reece PackerWanda Reagor, wife  Ability/Limitations of Caregiver: wife is of small stature so would not be able to give significant physical assist Caregiver Availability: 24/7 Discharge Plan Discussed with Primary Caregiver: Yes Is Caregiver In Agreement with Plan?: Yes Does Caregiver/Family have Issues with Lodging/Transportation while Pt is in Rehab?: Yes ([place] pt. on [address]    Goals/Additional Needs Patient/Family Goal for Rehab: supervision for PT/OT; n/a SLP Expected length of stay: 12-16 days Cultural Considerations: no Equipment Needs: TBD Pt/Family Agrees to Admission and willing to participate: Yes Program Orientation Provided & Reviewed with Pt/Caregiver Including Roles & Responsibilities: Yes   Decrease burden of Care through IP rehab admission: no   Possible need for SNF placement upon discharge: Not anticipated   Patient Condition: This patient's condition remains as documented in the consult dated 08/28/14 at 1657, in which the Rehabilitation Physician determined and documented that the patient's condition is appropriate for intensive rehabilitative care in an inpatient rehabilitation facility. Will admit to inpatient rehab today.  Preadmission Screen Completed By: Weldon PickingBLANKENSHIP, Elaina Cara, 08/30/2014 11:50 AM ______________________________________________________________________  Discussed status with Dr. Riley KillSwartz on 08/30/14 at 1145 and received telephone approval for admission today.  Admission Coordinator: Weldon PickingBLANKENSHIP, Trinidee Schrag, time [id number] Dorna Bloom/Date 08/30/14          Cosigned by: Ranelle OysterZachary T Swartz, MD at 08/30/2014 12:00 PM

## 2014-08-30 NOTE — PMR Pre-admission (Signed)
PMR Admission Coordinator Pre-Admission Assessment  Patient: Joshua Fields is an 63 y.o., male MRN: [id number] DOB: 02/03/1951 Height: 5\' 7"  (170.2 cm) Weight: 119.75 kg (264 lb)              Insurance Information HMO:     PPO:      PCP:      IPA:      80/20:      OTHER:  PRIMARY:  Medicare A and B      Policy#:  [id number]      Subscriber:  self CM Name:        Phone#:      Fax#:  Pre-Cert#:       Employer: retired Benefits:  Phone #:      Name:  Eff. Date: Part A 05/27/05; Part B 11/27/05     Deduct:  $1260       Out of Pocket Max:  no      Life Max:  no CIR:  100%      SNF:  100% first 20 days Outpatient:  80%     Co-Pay: 20% Home Health:  100%      Co-Pay:  no DME:  80%     Co-Pay:  20% Providers:  Pt. choice SECONDARY:  [employer]      Policy#:  [id number]      Subscriber:  self   Emergency Contact Information Contact Information    Name Relation Home Work [employer]   Bayman,WANDA  [phone]     [place]villeHollins,Tabatha Daughter [phone]     Dengel,Melissa Daughter [phone]       Current Medical History  Patient Admitting Diagnosis: Multitrauma after fall status post IM nail tibial fracture History of Present Illness: Joshua SledgeGary E Grissom is a 63 y.o.right handed male with history of diabetes mellitus peripheral neuropathy, hypertension, pulmonary emboli with DVT maintained on chronic Coumadin.Independent prior to admission living with his wife.Admitted 11/01/2015after a fall from a tree approximately 10 feet landing on left leg. There was no loss of consciousness. INR on admission of 1.89.Cranial CT scan negative.CT abdomen and pelvis with numerous rib fractures and several compression fractures in the spine appear to be chronic and not acute in nature.X-rays and imaging showed a closed displaced left tibial shaft fracture as well as closed displaced comminuted left distal fibular fracture.Underwent open treatment of left tibial shaft fracture with intramedullary nailing closed  treatment of left distal fibular fracture 08/28/2014 per Dr. Victorino DikeHewitt.Weightbearing as tolerated left lower extremity with cam boot. Hospital course pain management. Acute blood loss anemia 10.2 and monitored.Chronic Coumadin therapy resumed.Physical and occupational therapy evaluations completed 08/28/2014 with recommendations of physical medicine rehabilitation consult. Patient was admitted for comprehensive rehabilitation program      Past Medical History  Past Medical History  Diagnosis Date  . Diabetes mellitus without complication   . Hypertension   . PE (pulmonary embolism)   . DVT (deep venous thrombosis)   . Hyperlipidemia     Family History  family history is not on file.  Prior Rehab/Hospitalizations: [facility], about 2 years ago; pt. Unsure if SNF or CIR  Current Medications  Current facility-administered medications: 0.9 %  sodium chloride infusion, , Intravenous, Continuous, [name], MD, Last Rate: 75 mL/hr at 08/29/14 0243;  acetaminophen (TYLENOL) tablet 650 mg, 650 mg, Oral, Q6H PRN, Remo LippsJacquelyn Howells Allen, PA, 650 mg at 08/28/14 1624;  albuterol (PROVENTIL) (2.5 MG/3ML) 0.083% nebulizer solution 2.52 mg, 2.52 mg, Inhalation, Q6H PRN, Jonny RuizJohn  Victorino Dike, MD azelastine (ASTELIN) 0.1 % nasal spray 1 spray, 1 spray, Each Nare, Daily, 1 spray at 08/28/14 1117 **AND** fluticasone (FLONASE) 50 MCG/ACT nasal spray 1 spray, 1 spray, Each Nare, Daily, Toni Arthurs, MD, 1 spray at 08/29/14 1100;  docusate sodium (COLACE) capsule 100 mg, 100 mg, Oral, BID, Toni Arthurs, MD, 100 mg at 08/30/14 4696 ezetimibe-simvastatin (VYTORIN) 10-40 MG per tablet 1 tablet, 1 tablet, Oral, Daily, Toni Arthurs, MD, 1 tablet at 08/30/14 2952;  glimepiride (AMARYL) tablet 1 mg, 1 mg, Oral, Q breakfast, Toni Arthurs, MD, 1 mg at 08/30/14 8413;  HYDROmorphone (DILAUDID) injection 0.5-1 mg, 0.5-1 mg, Intravenous, Q1H PRN, Remo Lipps, PA, 1 mg at 08/30/14 0331 insulin aspart (novoLOG) injection 0-15  Units, 0-15 Units, Subcutaneous, TID WC, Toni Arthurs, MD, 2 Units at 08/30/14 [phone];  metoCLOPramide (REGLAN) tablet 5-10 mg, 5-10 mg, Oral, Q8H PRN **OR** metoCLOPramide (REGLAN) injection 5-10 mg, 5-10 mg, Intravenous, Q8H PRN, Toni Arthurs, MD;  ondansetron (ZOFRAN) tablet 4 mg, 4 mg, Oral, Q6H PRN **OR** ondansetron (ZOFRAN) injection 4 mg, 4 mg, Intravenous, Q6H PRN, Toni Arthurs, MD, 4 mg at 08/30/14 1027 oxyCODONE (Oxy IR/ROXICODONE) immediate release tablet 15 mg, 15 mg, Oral, Q3H PRN, Remo Lipps, PA, 15 mg at 08/30/14 0942;  pneumococcal 23 valent vaccine (PNU-IMMUNE) injection 0.5 mL, 0.5 mL, Intramuscular, Tomorrow-1000, Toni Arthurs, MD;  pregabalin (LYRICA) capsule 50 mg, 50 mg, Oral, TID, Toni Arthurs, MD, 50 mg at 08/30/14 2536;  senna (SENOKOT) tablet 17.2 mg, 2 tablet, Oral, BID, Toni Arthurs, MD, 17.2 mg at 08/30/14 6440 triamterene-hydrochlorothiazide (MAXZIDE-25) 37.5-25 MG per tablet 1 tablet, 1 tablet, Oral, Daily, Toni Arthurs, MD, 1 tablet at 08/30/14 [phone];  Warfarin - Pharmacist Dosing Inpatient, , Does not apply, q1800, Abran Duke, RPH  Patients Current Diet: Diet Carb Modified Diet - low sodium heart healthy  Precautions / Restrictions Precautions Precautions: Fall Other Brace/Splint: CAM boot when walking Restrictions Weight Bearing Restrictions: Yes LLE Weight Bearing: Weight bearing as tolerated Other Position/Activity Restrictions: with CAM boot (per OP REPORT)   Prior Activity Level Community (5-7x/wk): Pt. is out in community frequently grocery shopping, running errands and deer hunting.   Home Assistive Devices / Equipment Home Assistive Devices/Equipment: None Home Equipment: Cane - single point, Environmental consultant - 2 wheels, Bedside commode, Grab bars - tub/shower  Prior Functional Level Prior Function Level of Independence: Independent with assistive device(s) Comments: Daughter reports pt with frequent falls due to R LE weakness.    Current Functional  Level Cognition  Overall Cognitive Status: Within Functional Limits for tasks assessed Orientation Level: Oriented X4    Extremity Assessment (includes Sensation/Coordination)          ADLs  Overall ADL's : Needs assistance/impaired Eating/Feeding: Set up, Sitting Grooming: Set up, Sitting Upper Body Bathing: Set up, Sitting Lower Body Bathing: +2 for physical assistance, Maximal assistance, Sit to/from stand Upper Body Dressing : Set up, Sitting Lower Body Dressing: Maximal assistance, +2 for physical assistance, Sit to/from stand Toilet Transfer Details (indicate cue type and reason): did not attempt this session, likely slide transfer to [facility] General ADL Comments: Pt at max +2 phy A level for LB ADLs. Stood for 1.5 minutes with +2 min phy A. Pt with h/o RLE weakness.     Mobility  Overal bed mobility: Needs Assistance Bed Mobility: Supine to Sit Supine to sit: Mod assist General bed mobility comments: not addressed; pt up in chair and returned to chair     Transfers  Overall transfer  level: Needs assistance Equipment used: Rolling walker (2 wheeled) Transfers: Sit to/from Stand Sit to Stand: Mod assist Stand pivot transfers: Total assist General transfer comment: cues for hand placement and sequencing; incr time due to pain ; pushing up primarily through Rt LE due to incr pain with WB on Lt LE ; (A) to facilitate placement of lt LE when lowering to sitting position     Ambulation / Gait / Stairs / Wheelchair Mobility  Ambulation/Gait Ambulation/Gait assistance: +2 safety/equipment, Mod assist Ambulation Distance (Feet): 10 Feet Assistive device: Rolling walker (2 wheeled) Gait Pattern/deviations: Step-to pattern, Decreased stance time - left, Decreased step length - right, Trunk flexed, Antalgic Gait velocity: decreased due to pain  Gait velocity interpretation: <1.8 ft/sec, indicative of risk for recurrent falls General Gait Details: cues for upright posture and gt  sequencing; 2nd person for safety to follow with chair; pt limited by fatigue and pain; relying heavivly on UEs to relieve WB off Lt LE     Posture / Balance      Special needs/care consideration BiPAP/CPAP no Continuous Drip IV  NaCl 75 ml/hr         Oxygen   Yes currently at 3L/min; does not use O2 at home       Skin  No issues per pt.                               Bowel mgmt:  Episode of bowel incontinence 08/30/14; had been continent prior to this Bladder mgmt: continent , using urinal Diabetic mgmt  yes     Previous Home Environment Living Arrangements: Spouse/significant other, Children Available Help at Discharge: Available 24 hours/day, Family Type of Home: House Home Layout: One level, Laundry or work area in basement Home Access: Stairs to enter Entrance Stairs-Rails: Engineer, manufacturingight Entrance Stairs-Number of Steps: 2 Bathroom Shower/Tub: Health visitorWalk-in shower Bathroom Toilet: Standard Home Care Services: No  Discharge Living Setting Plans for Discharge Living Setting: Patient's home Type of Home at Discharge: House Discharge Home Layout: Two level, Laundry or work area in basement (pt. reports he lives in the basement in "man cave") Discharge Home Access: Level entry (level entry into basement ) Discharge Bathroom Shower/Tub: Tub/shower unit Discharge Bathroom Toilet: Standard Discharge Bathroom Accessibility: Yes How Accessible: Accessible via walker Does the patient have any problems obtaining your medications?: No  Social/Family/Support Systems Patient Roles: Spouse, Parent Anticipated Caregiver: Reece PackerWanda Holberg, wife  Ability/Limitations of Caregiver: wife is of small stature so would not be able to give significant physical assist Caregiver Availability: 24/7 Discharge Plan Discussed with Primary Caregiver: Yes Is Caregiver In Agreement with Plan?: Yes Does Caregiver/Family have Issues with Lodging/Transportation while Pt is in Rehab?: Yes (wife staying [place] pt. on [address], [place]    Goals/Additional Needs Patient/Family Goal for Rehab: supervision for PT/OT; n/a SLP Expected length of stay: 12-16 days Cultural Considerations: no Equipment Needs: TBD Pt/Family Agrees to Admission and willing to participate: Yes Program Orientation Provided & Reviewed with Pt/Caregiver Including Roles  & Responsibilities: Yes   Decrease burden of Care through IP rehab admission: no   Possible need for SNF placement upon discharge:  Not anticipated   Patient Condition: This patient's condition remains as documented in the consult dated 08/28/14 at 1657, in which the Rehabilitation Physician determined and documented that the patient's condition is appropriate for intensive rehabilitative care in an inpatient rehabilitation facility. Will admit to inpatient rehab today.  Preadmission  Screen Completed By:  Weldon Picking, 08/30/2014 11:50 AM ______________________________________________________________________   Discussed status with Dr.  Riley Kill on 08/30/14 at  1145  and received telephone approval for admission today.  Admission Coordinator:  Weldon Picking, time  8119  /Date 08/30/14

## 2014-08-30 NOTE — Progress Notes (Signed)
Erick Colace, MD Physician Signed Physical Medicine and Rehabilitation Consult Note 08/28/2014 1:45 PM  Related encounter: ED to Hosp-Admission (Current) from 08/27/2014 in [facility]    Expand All Collapse All        Physical Medicine and Rehabilitation Consult Reason for Consult:closed displaced left tibial shaft fracture, comminuted left distal fibular fracture Referring Physician: Dr. Victorino Dike   HPI: Joshua Fields is a 63 y.o.right handed male with history of diabetes mellitus peripheral neuropathy, hypertension, pulmonary emboli with DVT maintained on chronic Coumadin.Independent prior to admission living with his wife.Admitted 11/01/2015after a fall from a tree approximately 10 feet landing on left leg. There was no loss of consciousness. INR on admission of 1.89.Cranial CT scan negative.CT abdomen and pelvis with numerous rib fractures and several compression fractures in the spine appear to be chronic and not acute in nature.X-rays and imaging showed a closed displaced left tibial shaft fracture as well as closed displaced comminuted left distal fibular fracture.Underwent open treatment of left tibial shaft fracture with intramedullary nailing closed treatment of left distal fibular fracture 08/28/2014 per Dr. Victorino Dike.Weightbearing as tolerated left lower extremity with cam boot. Hospital course pain management. Chronic Coumadin therapy resumed.Physical therapy evaluation completed 08/28/2014 with recommendations of physical medicine rehabilitation consult.  Patient is lethargic but awakens to voice Review of Systems  Cardiovascular: Positive for palpitations.  Gastrointestinal: Positive for constipation.  Musculoskeletal: Positive for myalgias.  All other systems reviewed and are negative.  Past Medical History  Diagnosis Date  . Diabetes mellitus without complication   . Hypertension   . PE (pulmonary embolism)   . DVT (deep  venous thrombosis)   . Hyperlipidemia    Past Surgical History  Procedure Laterality Date  . Cervical fusion  2008  . Back surgery  1982  . Tibia im nail insertion Left 08/27/2014    DR HEWITT  . Tibia im nail insertion Left 08/27/2014    Procedure: INTRAMEDULLARY (IM) NAIL TIBIAL; Surgeon: Toni Arthurs, MD; Location: [facility] OR; Service: Orthopedics; Laterality: Left;   History reviewed. No pertinent family history. Social History:  reports that he has never smoked. His smokeless tobacco use includes Snuff. He reports that he does not drink alcohol or use illicit drugs. Allergies: No Known Allergies Medications Prior to Admission  Medication Sig Dispense Refill  . albuterol (PROVENTIL HFA;VENTOLIN HFA) 108 (90 BASE) MCG/ACT inhaler Inhale 1 puff into the lungs every 6 (six) hours as needed for wheezing or shortness of breath.    . Azelastine-Fluticasone (DYMISTA) 137-50 MCG/ACT SUSP Place 1 spray into the nose daily.    Marland Kitchen ezetimibe-simvastatin (VYTORIN) 10-40 MG per tablet Take 1 tablet by mouth daily.     Marland Kitchen glimepiride (AMARYL) 1 MG tablet Take 1 mg by mouth daily with breakfast.    . HYDROcodone-acetaminophen (NORCO) 10-325 MG per tablet Take 1 tablet by mouth every 6 (six) hours as needed for moderate pain.    . methocarbamol (ROBAXIN) 750 MG tablet Take 750 mg by mouth 4 (four) times daily.    . pregabalin (LYRICA) 50 MG capsule Take 50 mg by mouth 3 (three) times daily.    Marland Kitchen triamterene-hydrochlorothiazide (DYAZIDE) 37.5-25 MG per capsule Take 1 capsule by mouth daily.    Marland Kitchen warfarin (COUMADIN) 4 MG tablet Take 4-8 mg by mouth daily at 6 PM. Takes 8 mg on Fri,Sat,Sun Takes 4mg  all other days      Home: Home Living Family/patient expects to be discharged to:: Private residence Living  Arrangements: Spouse/significant other, Children Available Help at Discharge: Available 24 hours/day, Family Type of  Home: House Home Access: Stairs to enter [employer] of Steps: 2 Entrance Stairs-Rails: Right Home Layout: One level, Laundry or work area in basement Home Equipment: Gilmer Mor - single point, Environmental consultant - 2 wheels  Functional History: Prior Function Level of Independence: Independent with assistive device(s) Comments: Daughter reports pt with frequent falls due to R LE weakness.  Functional Status:  Mobility: Bed Mobility Overal bed mobility: Needs Assistance, +2 for physical assistance Bed Mobility: Supine to Sit Supine to sit: +2 for physical assistance, Min assist General bed mobility comments: Pt allowed daughter to A getting leg to side of bed by holding big toe and moving it that way. He need help getting trunk upright. Transfers Overall transfer level: Needs assistance Equipment used: Rolling walker (2 wheeled) Transfers: Sit to/from Stand, [employer] Transfers Sit to Stand: Mod assist, +2 physical assistance Stand pivot transfers: Total assist General transfer comment: CAM boot in room too small for patient and requested an XL. Kept pt as NWB L LE during treatment. He was able to stand, but could not shimmy or hop on R foot. Bed was moved and recliner chair brought behind patient.      ADL:    Cognition: Cognition Overall Cognitive Status: Within Functional Limits for tasks assessed Orientation Level: Oriented to person Cognition Arousal/Alertness: Awake/alert, Lethargic, Suspect due to medications Behavior During Therapy: WFL for tasks assessed/performed Overall Cognitive Status: Within Functional Limits for tasks assessed  Blood pressure 126/71, pulse 94, temperature 98.5 F (36.9 C), temperature source Oral, resp. rate 16, height 5\' 7"  (1.702 m), weight 119.75 kg (264 lb), SpO2 94 %. Physical Exam  Constitutional: He appears well-developed.  HENT:  Head: Normocephalic.  Eyes: EOM are normal.  Neck: Normal range of motion. Neck supple. No thyromegaly  present.  Cardiovascular: Normal rate and regular rhythm.  Respiratory: Effort normal and breath sounds normal. No respiratory distress.  GI: Soft. Bowel sounds are normal. He exhibits no distension.  Neurological:  Patient is a bit lethargic but arousable. Oriented 3. Follows simple commands.  Skin: Skin is warm and dry.  Left lower extremity with bulky dressing in place  Motor strength is 3 minus left deltoid 4 left bicep tricep grip 5 in the right deltoid, bicep, tricep, grip Right lower extremity 4/5 hip flexor and extensor ankle dorsiflexor plantar flexor Left lower extremity to minus hip flexor, 3 minus knee extensor ankle not tested secondary to splint Sensory intact to light touch in the hands and in the feet   Lab Results Last 24 Hours    Results for orders placed or performed during the hospital encounter of 08/27/14 (from the past 24 hour(s))  CDS serology Status: None   Collection Time: 08/27/14 4:18 PM  Result Value Ref Range   CDS serology specimen CDSCMT   Comprehensive metabolic panel Status: Abnormal   Collection Time: 08/27/14 4:18 PM  Result Value Ref Range   Sodium 139 137 - 147 mEq/L   Potassium 4.4 3.7 - 5.3 mEq/L   Chloride 100 96 - 112 mEq/L   CO2 27 19 - 32 mEq/L   Glucose, Bld 108 (H) 70 - 99 mg/dL   BUN 18 6 - 23 mg/dL   Creatinine, Ser 1.61 0.50 - 1.35 mg/dL   Calcium 8.9 8.4 - 09.6 mg/dL   Total Protein 6.7 6.0 - 8.3 g/dL   Albumin 3.7 3.5 - 5.2 g/dL   AST 24 0 -  37 U/L   ALT 16 0 - 53 U/L   Alkaline Phosphatase 58 39 - 117 U/L   Total Bilirubin 0.4 0.3 - 1.2 mg/dL   GFR calc non Af Amer >90 >90 mL/min   GFR calc Af Amer >90 >90 mL/min   Anion gap 12 5 - 15  CBC Status: Abnormal   Collection Time: 08/27/14 4:18 PM  Result Value Ref Range   WBC 8.5 4.0 - 10.5 K/uL   RBC 3.89 (L) 4.22 - 5.81 MIL/uL   Hemoglobin 12.7 (L) 13.0 -  17.0 g/dL   HCT 16.1 (L) 09.6 - 04.5 %   MCV 95.1 78.0 - 100.0 fL   MCH 32.6 26.0 - 34.0 pg   MCHC 34.3 30.0 - 36.0 g/dL   RDW 40.9 81.1 - 91.4 %   Platelets 181 150 - 400 K/uL  Ethanol Status: None   Collection Time: 08/27/14 4:18 PM  Result Value Ref Range   Alcohol, Ethyl (B) <11 0 - 11 mg/dL  Protime-INR Status: Abnormal   Collection Time: 08/27/14 4:18 PM  Result Value Ref Range   Prothrombin Time 21.9 (H) 11.6 - 15.2 seconds   INR 1.89 (H) 0.00 - 1.49  Sample to Blood Bank Status: None   Collection Time: 08/27/14 5:23 PM  Result Value Ref Range   Blood Bank Specimen SAMPLE AVAILABLE FOR TESTING    Sample Expiration 08/28/2014   Urinalysis, Routine w reflex microscopic Status: None   Collection Time: 08/27/14 8:28 PM  Result Value Ref Range   Color, Urine YELLOW YELLOW   APPearance CLEAR CLEAR   Specific Gravity, Urine 1.023 1.005 - 1.030   pH 5.5 5.0 - 8.0   Glucose, UA NEGATIVE NEGATIVE mg/dL   Hgb urine dipstick NEGATIVE NEGATIVE   Bilirubin Urine NEGATIVE NEGATIVE   Ketones, ur NEGATIVE NEGATIVE mg/dL   Protein, ur NEGATIVE NEGATIVE mg/dL   Urobilinogen, UA 0.2 0.0 - 1.0 mg/dL   Nitrite NEGATIVE NEGATIVE   Leukocytes, UA NEGATIVE NEGATIVE  Glucose, capillary Status: Abnormal   Collection Time: 08/28/14 12:37 AM  Result Value Ref Range   Glucose-Capillary 110 (H) 70 - 99 mg/dL  Glucose, capillary Status: Abnormal   Collection Time: 08/28/14 1:04 AM  Result Value Ref Range   Glucose-Capillary 162 (H) 70 - 99 mg/dL  Protime-INR Status: Abnormal   Collection Time: 08/28/14 5:33 AM  Result Value Ref Range   Prothrombin Time 22.1 (H) 11.6 - 15.2 seconds   INR 1.91 (H) 0.00 - 1.49  CBC Status: Abnormal   Collection Time: 08/28/14 5:33 AM  Result Value Ref Range   WBC  7.0 4.0 - 10.5 K/uL   RBC 3.67 (L) 4.22 - 5.81 MIL/uL   Hemoglobin 11.8 (L) 13.0 - 17.0 g/dL   HCT 78.2 (L) 95.6 - 21.3 %   MCV 96.2 78.0 - 100.0 fL   MCH 32.2 26.0 - 34.0 pg   MCHC 33.4 30.0 - 36.0 g/dL   RDW 08.6 57.8 - 46.9 %   Platelets 165 150 - 400 K/uL  Glucose, capillary Status: Abnormal   Collection Time: 08/28/14 6:48 AM  Result Value Ref Range   Glucose-Capillary 126 (H) 70 - 99 mg/dL  Glucose, capillary Status: Abnormal   Collection Time: 08/28/14 12:21 PM  Result Value Ref Range   Glucose-Capillary 118 (H) 70 - 99 mg/dL      Imaging Results (Last 48 hours)    Dg Tibia/fibula Left  08/28/2014 CLINICAL DATA: Left tibial fracture. Intra medullary nail. EXAM:  DG C-ARM 61-120 MIN; LEFT TIBIA AND FIBULA - 2 VIEW COMPARISON: None. FINDINGS: Intraoperative fluoroscopy was utilized for surgical control purposes, fluoroscopy time recorded at 1 min 1 second. Spot fluoroscopic images obtained of the left tibia demonstrate placement of an intra medullary rod with 2 proximal and 2 distal locking screws. Mostly transverse comminuted fractures are demonstrated in the proximal left tibial metaphysis and in the midshaft left tibia. Tibial fracture fragments appear to be in near anatomic alignment. There also comminuted fractures of the distal left fibula without internal fixation. IMPRESSION: Intraoperative fluoroscopy obtained for surgical control purposes, demonstrating intra medullary rod fixation of fractures of the left tibia. Electronically Signed By: Burman NievesWilliam Stevens M.D. On: 08/28/2014 00:10   Dg Tibia/fibula Left  08/27/2014 CLINICAL DATA: Fall from tree stand with leg pain EXAM: LEFT TIBIA AND FIBULA - 2 VIEW COMPARISON: None. FINDINGS: There are multiple fracture is identified. There is a transverse fracture through the mid the distal tibial diaphysis as well as a diffusely comminuted fracture of the  distal fibular metaphysis. Additionally an undisplaced fracture is noted in the proximal tibia. Soft tissue swelling is noted. No disruption of the ankle joint is noted. IMPRESSION: Multiple tibial and fibular fractures. Electronically Signed By: Alcide CleverMark Lukens M.D. On: 08/27/2014 18:20   Ct Head Wo Contrast  08/27/2014 CLINICAL DATA: Fall of a tree stand. Prior neck surgery 12 years ago. EXAM: CT HEAD WITHOUT CONTRAST CT CERVICAL SPINE WITHOUT CONTRAST TECHNIQUE: Multidetector CT imaging of the head and cervical spine was performed following the standard protocol without intravenous contrast. Multiplanar CT image reconstructions of the cervical spine were also generated. COMPARISON: Multiple exams, including 12/23/2007 and 09/29/2007 FINDINGS: CT HEAD FINDINGS The brainstem, cerebellum, cerebral peduncles, thalamus, basal ganglia, basilar cisterns, and ventricular system appear within normal limits. No intracranial hemorrhage, mass lesion, or acute CVA. Polypoid mucoperiosteal thickening noted in both maxillary sinuses. Nasal polyps are present and there is opacification of multiple ethmoid air cells and mucoperiosteal thickening in multiple ethmoid air cells. Chronic right frontal sinusitis. Patient appears to have multiple dental cavities including the left maxillary molars and the medial right maxillary premolar. Also the right maxillary canine appears to be only partially erupted. CT CERVICAL SPINE FINDINGS Anterior plate and screw fixator at C5-C6-C7 with solid interbody fusion an without significant osseous foraminal stenosis at these levels. Uncinate and facet spurring cause mild osseous foraminal narrowing on the left at C4-5. The dorsal column stimulator leads observed at the C6 and C7 levels. Degenerative spurring and the anterior C1-2 articulation. No vertebral subluxation is observed. No cervical spine fracture observed. IMPRESSION: 1. No acute intracranial or cervical spine  findings. 2. Chronic paranasal sinusitis. 3. Multiple dental cavities. 4. Osseous foraminal stenosis on the left at C4-5. 5. Dorsal column stimulator leads posteriorly at C6-7. Electronically Signed By: Herbie BaltimoreWalt Liebkemann M.D. On: 08/27/2014 19:09   Ct Chest W Contrast  08/27/2014 CLINICAL DATA: Fall from a tree stand. Left leg pain. EXAM: CT CHEST, ABDOMEN, AND PELVIS WITH CONTRAST TECHNIQUE: Multidetector CT imaging of the chest, abdomen and pelvis was performed following the standard protocol during bolus administration of intravenous contrast. CONTRAST: 100mL OMNIPAQUE IOHEXOL 300 MG/ML SOLN COMPARISON: Chest radiograph from 08/27/2014 ; prior chest CT dated 12/23/2007 FINDINGS: CT CHEST FINDINGS No aortic dissection or mediastinal hematoma. No obvious signs of pulmonary embolus although today's exam was performed as a regular diagnostic CT and accordingly does not have high sensitivity for smaller emboli. Atherosclerotic aortic arch. Scattered small thoracic lymph nodes are not pathologically  enlarged. The no pleural effusion. Lingular bulla, similar to prior. No pneumothorax. Dependent subsegmental atelectasis in both lower lobes. No pulmonary contusion. Various left-sided rib deformities are new compared to 2/20 03/2008 but appear to likely be chronic/remote rib fractures. However, a left anterior seventh rib fracture has some immature/woven bone wound along its margins indicating a late subacute fracture. This is not thought to be from an immediately acute injury. Correlate with the patient's trauma history. Deformity of the right scapula likewise appears old. Old right rib fractures noted. Prior posterior decompression from T7 through T9. Vertebral hemangioma at T10 Schmorl's node along the superior endplate of T12. Abnormal anterior wedging of the T3 vertebra with about 45% loss of height, new compared to 12/23/2007, but probably old or late subacute given the overall appearance. CT  ABDOMEN AND PELVIS FINDINGS Hepatobiliary: Unremarkable Pancreas: Unremarkable Spleen: 6 mm hypodense lesion in the dome of the spleen, image 37 series 2, not readily apparent on the prior CT scan from 12/23/2007. Adrenals/Urinary Tract: 1.2 by 1.2 cm left adrenal nodule, washout characteristics typical for adenoma. Is 2.7 by 3.6 cm cyst of the left mid to lower kidney. Stomach/Bowel: Scattered descending and sigmoid colon diverticula. Vascular/Lymphatic: Aortoiliac atherosclerotic vascular disease. Reproductive: Mild prominence of the prostate gland which measures 5.2 by 4.0 cm on image 112 of series 2. No prostate asymmetry. Other: No supplemental non-categorized findings. Musculoskeletal: Does bridging bone across the left posterior sacroiliac joint. Posterolateral rod and pedicle screw fixation at L4-L5-S1 with solid facet fusion and 1.3 cm anterolisthesis at L5-S1. Chronic appearing wedge compression fracture at L1 with 25% loss of height and about 3 mm of posterior bony retropulsion which appears chronic. Umbilical hernia contains a 4.2 by 3.2 by 3.0 cm herniated segmental omental adipose tissue. IMPRESSION: 1. Peripheral hypodense lesion in the dome of the spleen is rounded and not associated with perisplenic ascites. Accordingly I favor this is being a small benign splenic hypodense lesion rather than a grade 1 laceration. On the other hand, the lesion was not well visualized on the prior CT chest from 12/23/2007. Accordingly, there is a small chance that this could represent a tiny peripheral grade 1 splenic laceration. 2. Numerous rib fractures and several compression fractures in the spine seemed chronic or late subacute, and not acute in nature. Similarly the right scapular fracture looks old. 3. Small left adrenal adenoma. 4. Various incidental/cancel area findings, including atherosclerosis, mildly prominent prostate gland, mild sigmoid colon diverticulosis, postoperative findings in the  thoracic and lumbar spine, and a moderate-sized umbilical hernia containing omental adipose tissue. Electronically Signed By: Herbie Baltimore M.D. On: 08/27/2014 20:54   Ct Cervical Spine Wo Contrast  08/27/2014 CLINICAL DATA: Fall of a tree stand. Prior neck surgery 12 years ago. EXAM: CT HEAD WITHOUT CONTRAST CT CERVICAL SPINE WITHOUT CONTRAST TECHNIQUE: Multidetector CT imaging of the head and cervical spine was performed following the standard protocol without intravenous contrast. Multiplanar CT image reconstructions of the cervical spine were also generated. COMPARISON: Multiple exams, including 12/23/2007 and 09/29/2007 FINDINGS: CT HEAD FINDINGS The brainstem, cerebellum, cerebral peduncles, thalamus, basal ganglia, basilar cisterns, and ventricular system appear within normal limits. No intracranial hemorrhage, mass lesion, or acute CVA. Polypoid mucoperiosteal thickening noted in both maxillary sinuses. Nasal polyps are present and there is opacification of multiple ethmoid air cells and mucoperiosteal thickening in multiple ethmoid air cells. Chronic right frontal sinusitis. Patient appears to have multiple dental cavities including the left maxillary molars and the medial right maxillary premolar. Also the  right maxillary canine appears to be only partially erupted. CT CERVICAL SPINE FINDINGS Anterior plate and screw fixator at C5-C6-C7 with solid interbody fusion an without significant osseous foraminal stenosis at these levels. Uncinate and facet spurring cause mild osseous foraminal narrowing on the left at C4-5. The dorsal column stimulator leads observed at the C6 and C7 levels. Degenerative spurring and the anterior C1-2 articulation. No vertebral subluxation is observed. No cervical spine fracture observed. IMPRESSION: 1. No acute intracranial or cervical spine findings. 2. Chronic paranasal sinusitis. 3. Multiple dental cavities. 4. Osseous foraminal stenosis on the left  at C4-5. 5. Dorsal column stimulator leads posteriorly at C6-7. Electronically Signed By: Herbie Baltimore M.D. On: 08/27/2014 19:09   Ct Abdomen Pelvis W Contrast  08/27/2014 CLINICAL DATA: Fall from a tree stand. Left leg pain. EXAM: CT CHEST, ABDOMEN, AND PELVIS WITH CONTRAST TECHNIQUE: Multidetector CT imaging of the chest, abdomen and pelvis was performed following the standard protocol during bolus administration of intravenous contrast. CONTRAST: OMNIPAQUE IOHEXOL 300 MG/ML SOLN COMPARISON: Chest radiograph from 08/27/2014 ; prior chest CT dated 12/23/2007 FINDINGS: CT CHEST FINDINGS No aortic dissection or mediastinal hematoma. No obvious signs of pulmonary embolus although today's exam was performed as a regular diagnostic CT and accordingly does not have high sensitivity for smaller emboli. Atherosclerotic aortic arch. Scattered small thoracic lymph nodes are not pathologically enlarged. The no pleural effusion. Lingular bulla, similar to prior. No pneumothorax. Dependent subsegmental atelectasis in both lower lobes. No pulmonary contusion. Various left-sided rib deformities are new compared to 2/20 03/2008 but appear to likely be chronic/remote rib fractures. However, a left anterior seventh rib fracture has some immature/woven bone wound along its margins indicating a late subacute fracture. This is not thought to be from an immediately acute injury. Correlate with the patient's trauma history. Deformity of the right scapula likewise appears old. Old right rib fractures noted. Prior posterior decompression from T7 through T9. Vertebral hemangioma at T10 Schmorl's node along the superior endplate of T12. Abnormal anterior wedging of the T3 vertebra with about 45% loss of height, new compared to 12/23/2007, but probably old or late subacute given the overall appearance. CT ABDOMEN AND PELVIS FINDINGS Hepatobiliary: Unremarkable Pancreas: Unremarkable Spleen: 6 mm hypodense  lesion in the dome of the spleen, image 37 series 2, not readily apparent on the prior CT scan from 12/23/2007. Adrenals/Urinary Tract: 1.2 by 1.2 cm left adrenal nodule, washout characteristics typical for adenoma. Is 2.7 by 3.6 cm cyst of the left mid to lower kidney. Stomach/Bowel: Scattered descending and sigmoid colon diverticula. Vascular/Lymphatic: Aortoiliac atherosclerotic vascular disease. Reproductive: Mild prominence of the prostate gland which measures 5.2 by 4.0 cm on image 112 of series 2. No prostate asymmetry. Other: No supplemental non-categorized findings. Musculoskeletal: Does bridging bone across the left posterior sacroiliac joint. Posterolateral rod and pedicle screw fixation at L4-L5-S1 with solid facet fusion and 1.3 cm anterolisthesis at L5-S1. Chronic appearing wedge compression fracture at L1 with 25% loss of height and about 3 mm of posterior bony retropulsion which appears chronic. Umbilical hernia contains a 4.2 by 3.2 by 3.0 cm herniated segmental omental adipose tissue. IMPRESSION: 1. Peripheral hypodense lesion in the dome of the spleen is rounded and not associated with perisplenic ascites. Accordingly I favor this is being a small benign splenic hypodense lesion rather than a grade 1 laceration. On the other hand, the lesion was not well visualized on the prior CT chest from 12/23/2007. Accordingly, there is a small chance that this could  represent a tiny peripheral grade 1 splenic laceration. 2. Numerous rib fractures and several compression fractures in the spine seemed chronic or late subacute, and not acute in nature. Similarly the right scapular fracture looks old. 3. Small left adrenal adenoma. 4. Various incidental/cancel area findings, including atherosclerosis, mildly prominent prostate gland, mild sigmoid colon diverticulosis, postoperative findings in the thoracic and lumbar spine, and a moderate-sized umbilical hernia containing omental adipose tissue.  Electronically Signed By: Herbie BaltimoreWalt Liebkemann M.D. On: 08/27/2014 20:54   Dg Pelvis Portable  08/27/2014 CLINICAL DATA: Tree stand EXAM: PORTABLE PELVIS 1-2 VIEWS COMPARISON: None. FINDINGS: There is no evidence of pelvic fracture or dislocation. There is slight symmetric narrowing of both hip joints. There is postoperative change at L5 and S1. IMPRESSION: Mild symmetric narrowing both hip joints. No apparent fracture or dislocation. Electronically Signed By: Bretta BangWilliam Woodruff M.D. On: 08/27/2014 18:18   Dg Chest Port 1 View  08/27/2014 CLINICAL DATA: Patient fell from tree stand. Pain EXAM: PORTABLE CHEST - 1 VIEW COMPARISON: Chest CT December 23, 2007 FINDINGS: There is no edema or consolidation. Heart is mildly enlarged with pulmonary vascularity within normal limits. No adenopathy. No pneumothorax. There are several apparent rib fractures on the right. There is a stimulator with the proximal aspect overlying the lower cervical region ; the actual tip is not seen. There is postoperative change in the lower cervical region. IMPRESSION: Findings suspicious for several rib fractures on the right. No pneumothorax. No edema or consolidation. Heart is mildly enlarged. Electronically Signed By: Bretta BangWilliam Woodruff M.D. On: 08/27/2014 18:17   Dg C-arm 1-60 Min  08/28/2014 CLINICAL DATA: Left tibial fracture. Intra medullary nail. EXAM: DG C-ARM 61-120 MIN; LEFT TIBIA AND FIBULA - 2 VIEW COMPARISON: None. FINDINGS: Intraoperative fluoroscopy was utilized for surgical control purposes, fluoroscopy time recorded at 1 min 1 second. Spot fluoroscopic images obtained of the left tibia demonstrate placement of an intra medullary rod with 2 proximal and 2 distal locking screws. Mostly transverse comminuted fractures are demonstrated in the proximal left tibial metaphysis and in the midshaft left tibia. Tibial fracture fragments appear to be in near anatomic alignment. There also  comminuted fractures of the distal left fibula without internal fixation. IMPRESSION: Intraoperative fluoroscopy obtained for surgical control purposes, demonstrating intra medullary rod fixation of fractures of the left tibia. Electronically Signed By: Burman NievesWilliam Stevens M.D. On: 08/28/2014 00:10     Assessment/Plan: Diagnosis: Multitrauma after fall postop day #1 status post IM nail tibial fracture 1. Does the need for close, 24 hr/day medical supervision in concert with the patient's rehab needs make it unreasonable for this patient to be served in a less intensive setting? Potentially 2. 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 Due to bladder management, bowel management, safety, skin/wound care, disease management, medication administration, pain management and patient education, does the patient require 24 hr/day rehab nursing? Potentially 4. Does the patient require coordinated care of a physician, rehab nurse, PT (1-2 hrs/day, 5 days/week) and OT (1-2 hrs/day, 5 days/week) to address physical and functional deficits in the context of the above medical diagnosis(es)? Yes and Potentially Addressing deficits in the following areas: balance, endurance, locomotion, strength, transferring, bowel/bladder control, bathing, dressing, toileting and cognition 5. Can the patient actively participate in an intensive therapy program of at least 3 hrs of therapy per day at least 5 days per week? Potentially 6. The potential for patient  to make measurable gains while on inpatient rehab is good 7. Anticipated functional outcomes upon discharge from inpatient rehab are supervision with PT, supervision with OT, n/a with SLP. 8. Estimated rehab length of stay to reach the above functional goals is: 12-16 days 9. Does the  patient have adequate social supports to accommodate these discharge functional goals? Potentially 10. Anticipated D/C setting: Home 11. Anticipated post D/C treatments: HH therapy 12. Overall Rehab/Functional Prognosis: good  RECOMMENDATIONS: This patient's condition is appropriate for continued rehabilitative care in the following setting: CIR once patient is able to tolerate therapy and is less lethargic Patient has agreed to participate in recommended program. Potentially Note that insurance prior authorization may be required for reimbursement for recommended care.  Comment: Acute on chronic pain may be difficult to manage    08/28/2014

## 2014-08-31 ENCOUNTER — Inpatient Hospital Stay (HOSPITAL_COMMUNITY): Payer: Medicare Other | Admitting: Occupational Therapy

## 2014-08-31 ENCOUNTER — Inpatient Hospital Stay (HOSPITAL_COMMUNITY): Payer: PRIVATE HEALTH INSURANCE | Admitting: Rehabilitation

## 2014-08-31 ENCOUNTER — Inpatient Hospital Stay (HOSPITAL_COMMUNITY): Payer: Medicare Other

## 2014-08-31 DIAGNOSIS — S82202D Unspecified fracture of shaft of left tibia, subsequent encounter for closed fracture with routine healing: Secondary | ICD-10-CM

## 2014-08-31 DIAGNOSIS — S2239XD Fracture of one rib, unspecified side, subsequent encounter for fracture with routine healing: Secondary | ICD-10-CM

## 2014-08-31 DIAGNOSIS — W14XXXD Fall from tree, subsequent encounter: Secondary | ICD-10-CM

## 2014-08-31 DIAGNOSIS — S82302D Unspecified fracture of lower end of left tibia, subsequent encounter for closed fracture with routine healing: Secondary | ICD-10-CM

## 2014-08-31 LAB — COMPREHENSIVE METABOLIC PANEL
ALT: 24 U/L (ref 0–53)
AST: 27 U/L (ref 0–37)
Albumin: 3 g/dL — ABNORMAL LOW (ref 3.5–5.2)
Alkaline Phosphatase: 52 U/L (ref 39–117)
Anion gap: 12 (ref 5–15)
BILIRUBIN TOTAL: 0.5 mg/dL (ref 0.3–1.2)
BUN: 12 mg/dL (ref 6–23)
CHLORIDE: 97 meq/L (ref 96–112)
CO2: 32 mEq/L (ref 19–32)
CREATININE: 0.76 mg/dL (ref 0.50–1.35)
Calcium: 8.7 mg/dL (ref 8.4–10.5)
GFR calc Af Amer: >90 mL/min (ref >90)
Glucose, Bld: 132 mg/dL — ABNORMAL HIGH (ref 70–99)
Potassium: 3.5 mEq/L — ABNORMAL LOW (ref 3.7–5.3)
Sodium: 141 mEq/L (ref 137–147)
Total Protein: 5.8 g/dL — ABNORMAL LOW (ref 6.0–8.3)

## 2014-08-31 LAB — CBC WITH DIFFERENTIAL/PLATELET
BASOS ABS: 0 10*3/uL (ref 0.0–0.1)
Basophils Relative: 0 % (ref 0–1)
Eosinophils Absolute: 0.3 10*3/uL (ref 0.0–0.7)
Eosinophils Relative: 6 % — ABNORMAL HIGH (ref 0–5)
HCT: 29.9 % — ABNORMAL LOW (ref 39.0–52.0)
HEMOGLOBIN: 10 g/dL — AB (ref 13.0–17.0)
Lymphocytes Relative: 20 % (ref 12–46)
Lymphs Abs: 1 10*3/uL (ref 0.7–4.0)
MCH: 32.4 pg (ref 26.0–34.0)
MCHC: 33.4 g/dL (ref 30.0–36.0)
MCV: 96.8 fL (ref 78.0–100.0)
MONO ABS: 0.4 10*3/uL (ref 0.1–1.0)
Monocytes Relative: 9 % (ref 3–12)
Neutro Abs: 3.2 10*3/uL (ref 1.7–7.7)
Neutrophils Relative %: 65 % (ref 43–77)
Platelets: 154 10*3/uL (ref 150–400)
RBC: 3.09 MIL/uL — ABNORMAL LOW (ref 4.22–5.81)
RDW: 13.4 % (ref 11.5–15.5)
WBC: 5 10*3/uL (ref 4.0–10.5)

## 2014-08-31 LAB — GLUCOSE, CAPILLARY
GLUCOSE-CAPILLARY: 113 mg/dL — AB (ref 70–99)
GLUCOSE-CAPILLARY: 105 mg/dL — AB (ref 70–99)
Glucose-Capillary: 119 mg/dL — ABNORMAL HIGH (ref 70–99)
Glucose-Capillary: 114 mg/dL — ABNORMAL HIGH (ref 70–99)
Glucose-Capillary: 122 mg/dL — ABNORMAL HIGH (ref 70–99)

## 2014-08-31 LAB — PROTIME-INR
INR: 1.57 — ABNORMAL HIGH (ref 0.00–1.49)
Prothrombin Time: 18.9 seconds — ABNORMAL HIGH (ref 11.6–15.2)

## 2014-08-31 MED ORDER — LOPERAMIDE HCL 2 MG PO CAPS
2.0000 mg | ORAL_CAPSULE | ORAL | Status: DC | PRN
  Administered 2014-08-31: 2 mg via ORAL
  Filled 2014-08-31 (×2): qty 1

## 2014-08-31 MED ORDER — POLYETHYLENE GLYCOL 3350 17 G PO PACK
17.0000 g | PACK | Freq: Every day | ORAL | Status: DC
  Administered 2014-08-31 – 2014-09-07 (×8): 17 g via ORAL
  Filled 2014-08-31 (×10): qty 1

## 2014-08-31 MED ORDER — WARFARIN SODIUM 4 MG PO TABS
8.0000 mg | ORAL_TABLET | Freq: Once | ORAL | Status: AC
  Administered 2014-08-31: 8 mg via ORAL
  Filled 2014-08-31: qty 2

## 2014-08-31 NOTE — Care Management Note (Signed)
[facility] Individual Statement of Services  Patient Name:  Joshua Fields  Date:  08/31/2014  Welcome to the [facility].  Our goal is to provide you with an individualized program based on your diagnosis and situation, designed to meet your specific needs.  With this comprehensive rehabilitation program, you will be expected to participate in at least 3 hours of rehabilitation therapies Monday-Friday, with modified therapy programming on the weekends.  Your rehabilitation program will include the following services:  Physical Therapy (PT), Occupational Therapy (OT), 24 hour per day rehabilitation nursing, Case Management (Social Worker), Rehabilitation Medicine, Nutrition Services and Pharmacy Services  Weekly team conferences will be held on Wednesday to discuss your progress.  Your Social Worker will talk with you frequently to get your input and to update you on team discussions.  Team conferences with you and your family in attendance may also be held.  Expected length of stay: 7-9 days Overall anticipated outcome: mod/i-supervision level  Depending on your progress and recovery, your program may change. Your Social Worker will coordinate services and will keep you informed of any changes. Your Social Worker's name and contact numbers are listed  below.  The following services may also be recommended but are not provided by the [facility]:   Driving Evaluations  Home Health Rehabiltiation Services  Outpatient Rehabilitation Services    Arrangements will be made to provide these services after discharge if needed.  Arrangements include referral to agencies that provide these services.  Your insurance has been verified to be:  Medicare & [employer] Your primary doctor is:  Aleatha BorerJames Manning  Pertinent information will be shared with your doctor and your insurance company.  Social Worker:  Dossie DerBecky Rosemond Lyttle, SW [phone] or ([phone])  [phone]  Information discussed with and copy given to patient by: Lucy Chrisupree, Carmine Carrozza G, 08/31/2014, 1:55 PM

## 2014-08-31 NOTE — Plan of Care (Signed)
Problem: RH BOWEL ELIMINATION Goal: RH STG MANAGE BOWEL WITH ASSISTANCE STG Manage Bowel with modified independence  Outcome: Progressing Goal: RH STG MANAGE BOWEL W/MEDICATION W/ASSISTANCE STG Manage Bowel with Medication with Min Assistance.  Outcome: Progressing  Problem: RH BLADDER ELIMINATION Goal: RH STG MANAGE BLADDER WITH ASSISTANCE STG Manage Bladder With Modified indepedence  Outcome: Progressing  Problem: RH SKIN INTEGRITY Goal: RH STG SKIN FREE OF INFECTION/BREAKDOWN Patient will have no new skin breakdown/infection while on rehab with min assist of caregiver  Outcome: Progressing Goal: RH STG MAINTAIN SKIN INTEGRITY WITH ASSISTANCE STG Maintain Skin Integrity With Min Assistance.  Outcome: Progressing Goal: RH STG ABLE TO PERFORM INCISION/WOUND CARE W/ASSISTANCE STG Able To Perform Incision/Wound Care With BJ'sMin Assistance.  Outcome: Progressing  Problem: RH SAFETY Goal: RH STG ADHERE TO SAFETY PRECAUTIONS W/ASSISTANCE/DEVICE STG Adhere to Safety Precautions With Min Assist and Assistance/Device.  Outcome: Progressing  Problem: RH PAIN MANAGEMENT Goal: RH STG PAIN MANAGED AT OR BELOW PT'S PAIN GOAL 3 or less on scale of 1-10  Outcome: Not Progressing

## 2014-08-31 NOTE — Progress Notes (Signed)
Occupational Therapy Assessment and Plan and OT intervention Sessions   Patient Details  Name: Joshua Fields MRN: [id number] Date of Birth: [birth year]  OT Diagnosis: abnormal posture, acute pain, muscle weakness (generalized) and pain in joint Rehab Potential: Rehab Potential: Good ELOS: 7-9 days   Today's Date: 08/31/2014 OT Individual Time: 7494-4967 and 1257- 1327 OT Individual Time Calculation (min): 60 min  And 30 min  Problem List:  Patient Active Problem List   Diagnosis Date Noted  . Trauma 08/30/2014  . Fracture of tibial shaft, closed   . Fracture of distal end of tibia with fibula   . Fall from tree   . Tibial fracture 08/27/2014  . Abn Findings-GI Tract 08/15/2008  . PULMONARY EMBOLISM 03/17/2008  . DM 03/09/2008  . HYPERLIPIDEMIA 03/09/2008  . HYPERTENSION 03/09/2008  . ALLERGIC RHINITIS 03/09/2008  . HEADACHE, CHRONIC 03/09/2008  . DYSPNEA 03/09/2008    Past Medical History:  Past Medical History  Diagnosis Date  . Diabetes mellitus without complication   . Hypertension   . PE (pulmonary embolism)   . DVT (deep venous thrombosis)   . Hyperlipidemia    Past Surgical History:  Past Surgical History  Procedure Laterality Date  . Cervical fusion  2008  . Back surgery  1982  . Tibia im nail insertion Left 08/27/2014    DR HEWITT  . Tibia im nail insertion Left 08/27/2014    Procedure: INTRAMEDULLARY (IM) NAIL TIBIAL;  Surgeon: Wylene Simmer, MD;  Location: [place];  Service: Orthopedics;  Laterality: Left;    Assessment & Plan Clinical Impression: Patient is a 63 y.o. 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 There was no loss of consciousness. INR on admission of 1.89.Cranial CT scan negative.CT abdomen and pelvis with numerous  rib fractures and several compression fractures in the spine appear to be chronic and not acute in nature.X-rays and imaging showed a closed displaced left tibial shaft fracture as well as closed displaced comminuted left distal fibular fracture.Underwent open treatment of left tibial shaft fracture with intramedullary nailing closed treatment of left distal fibular fracture 08/28/2014 per Dr. Doran Durand.Weightbearing as tolerated left lower extremity with cam boot. Hospital course pain management. Acute blood loss anemia 10.2 and monitored.Chronic Coumadin therapy resumed.Physical and occupational therapy evaluations completed 08/28/2014 with recommendations of physical medicine rehabilitation consult. Patient was admitted for comprehensive rehabilitation program . Patient transferred to CIR on 08/30/2014 .    Patient currently requires min A- Max A with basic self-care skills secondary to muscle weakness, decreased cardiorespiratoy endurance and decreased oxygen support and decreased standing balance, decreased postural control, decreased balance strategies and not wanting to place weight unto L LE.  Prior to hospitalization, patient could complete ADLs with modified independent .  Patient will benefit from skilled intervention to increase independence with basic self-care skills prior to discharge home with care partner.  Anticipate patient will require intermittent supervision and follow up outpatient.    Skilled Therapeutic Intervention Session 1: Upon entering the room, pt supine in bed with 7/10 pain in L LE described as dull ache. RN present to give medication. OT educated pt and his wife on OT purpose, POC, and goals within inpatient rehab with individuals verbalizing understanding. Pt performed bathing and dressing seated on EOB with steady assist for STS. Pt with complaints of stomach  ache during session but never requesting to go to bathroom. Use of RW for STS as well as for ambulation of ~ 6 steps  to recliner chair to sit after tasks completed. Pt very anxious about movement and CAM boot donned with ambulation. Pt observed to not put weight through L LE secondary to pain and insteading putting weight through B UEs for weight shifting with ambulation. Pt seated in recliner chair with quick release belt donned and L LE repositioned in order to decrease pain. Wife remains present in room upon exiting.   Session 2: Upon entering the room, pt remains supine in bed with 7/10 pain in L LE. Pt performs supine >sit with Min A and assistance to donn CAM boot. Pt ambulated with increased time and steady assist ~ 10 feet to bathroom for toileting. Toilet transfer with Min A onto toilet with use of grab bar and RW. Pt requiring assistance with clothing management secondary to balance. Pt remained seated on toilet with time up for session and RN and NT notified of pt on toilet. Wife present in room providing supervision until pt ready to stand and notify RN. No additional concerns or questions for pt or caregiver this session upon exiting the room.   OT Evaluation Precautions/Restrictions  Restrictions Weight Bearing Restrictions: Yes LLE Weight Bearing: Weight bearing as tolerated (In CAM boot) General Chart Reviewed: Yes Pain Pain Assessment Pain Assessment: 0-10 Pain Score: 7  Pain Type: Surgical pain Pain Location: Leg Pain Orientation: Left Pain Descriptors / Indicators: Aching Pain Frequency: Constant Pain Onset: On-going Patients Stated Pain Goal: 3 Pain Intervention(s): Medication (See eMAR);Repositioned;Emotional support Multiple Pain Sites: No Home Living/Prior Functioning Home Living Living Arrangements: Spouse/significant other Available Help at Discharge: Available 24 hours/day, Family Type of Home: House Home Access: Stairs to enter [employer] of Steps: 4 Entrance Stairs-Rails: Right, Left, Can reach both Home Layout: One level, Laundry or work area in basement   Lives With: Spouse IADL History Homemaking Responsibilities: No (Wife reports performing home management and cooking tasks at home) Prior Function Level of Independence: Requires assistive device for independence  Able to Take Stairs?: Yes Driving: Yes Vocation: On disability Comments: Pt reports multiple falls at home Vision/Perception  Vision- History Baseline Vision/History: No visual deficits Patient Visual Report: No change from baseline Vision- Assessment Vision Assessment?: No apparent visual deficits  Cognition Overall Cognitive Status: Within Functional Limits for tasks assessed Arousal/Alertness: Suspect due to medications Orientation Level: Disoriented to place;Oriented to person;Oriented to time;Oriented to situation (Knew he was in rehab but did reported being in a facility located in state of [place]) Attention: Selective Selective Attention: Appears intact Memory: Appears intact Awareness: Impaired Awareness Impairment: Anticipatory impairment Safety/Judgment: Appears intact Sensation Sensation Light Touch: Appears Intact Stereognosis: Not tested Hot/Cold: Appears Intact Proprioception: Appears Intact Coordination Gross Motor Movements are Fluid and Coordinated: Yes Fine Motor Movements are Fluid and Coordinated: Yes Finger Nose Finger Test: WFLs Heel Shin Test: decreased/delayed due to pain Motor  Motor Motor: Other (comment) Motor - Skilled Clinical Observations: decreased functional balance, mobility, safety awareness, and strength in L LE Mobility  Bed Mobility Bed Mobility: Sit to Supine Sit to Supine: 4: Min assist Sit to Supine - Details: Verbal cues for sequencing;Verbal cues for technique;Verbal cues for precautions/safety;Manual facilitation for placement Transfers Transfers: Sit to Stand;Stand to Sit Sit to Stand: 4: Min guard;4: Min assist Sit to Stand Details: Verbal cues for sequencing;Verbal cues for technique;Verbal cues for  precautions/safety;Manual facilitation for weight shifting Stand to  Sit: 4: Min assist;4: Min guard Stand to Sit Details (indicate cue type and reason): Verbal cues for sequencing;Verbal cues for technique;Verbal cues for precautions/safety  Trunk/Postural Assessment  Cervical Assessment Cervical Assessment: Within Functional Limits Thoracic Assessment Thoracic Assessment: Exceptions to [facility] (forward shoulder) Lumbar Assessment Lumbar Assessment: Exceptions to WFL (stiff, decreased ROM) Postural Control Postural Control: Deficits on evaluation Postural Limitations: Pt with decreased flexibility in spine with decreased balance, esp during more dynamic tasks  Balance Balance Balance Assessed: Yes Static Sitting Balance Static Sitting - Balance Support: Feet supported;Bilateral upper extremity supported Static Sitting - Level of Assistance: 6: Modified independent (Device/Increase time) Dynamic Sitting Balance Dynamic Sitting - Balance Support: Feet supported;Right upper extremity supported Dynamic Sitting - Level of Assistance: 5: Stand by assistance Static Standing Balance Static Standing - Balance Support: During functional activity;Left upper extremity supported;Bilateral upper extremity supported Static Standing - Level of Assistance: 4: Min assist Dynamic Standing Balance Dynamic Standing - Balance Support: Right upper extremity supported;During functional activity Dynamic Standing - Level of Assistance: 4: Min assist Dynamic Standing - Balance Activities: Other (comment) Dynamic Standing - Comments: during functional tasks of LB dressing Extremity/Trunk Assessment RUE Assessment RUE Assessment: Within Functional Limits LUE Assessment LUE Assessment: Within Functional Limits  FIM:  FIM - Eating Eating Activity: 7: Complete independence:no helper FIM - Grooming Grooming Steps: Wash, rinse, dry face;Wash, rinse, dry hands;Oral care, brush teeth, clean dentures;Brush, comb  hair Grooming: 5: Set-up assist to obtain items FIM - Bathing Bathing Steps Patient Completed: Chest;Right Arm;Left Arm;Abdomen;Front perineal area;Buttocks;Right upper leg;Left upper leg Bathing: 4: Min-Patient completes 8-9 24f10 parts or 75+ percent FIM - Upper Body Dressing/Undressing Upper body dressing/undressing steps patient completed: Thread/unthread right sleeve of pullover shirt/dresss;Thread/unthread left sleeve of pullover shirt/dress;Put head through opening of pull over shirt/dress;Pull shirt over trunk Upper body dressing/undressing: 5: Set-up assist to: Obtain clothing/put away FIM - Lower Body Dressing/Undressing Lower body dressing/undressing steps patient completed: Thread/unthread right pants leg;Don/Doff right shoe Lower body dressing/undressing: 2: Max-Patient completed 25-49% of tasks FIM - BControl and instrumentation engineerDevices: Walker;Arm rests Bed/Chair Transfer: 3: Supine > Sit: Mod A (lifting assist/Pt. 50-74%/lift 2 legs;4: Bed > Chair or W/C: Min A (steadying Pt. > 75%) FIM - Tub/Shower Transfers Tub/shower Transfers: 0-Activity did not occur or was simulated   Refer to Care Plan for Long Term Goals  Recommendations for other services: None  Discharge Criteria: Patient will be discharged from OT if patient refuses treatment 3 consecutive times without medical reason, if treatment goals not met, if there is a change in medical status, if patient makes no progress towards goals or if patient is discharged from hospital.  The above assessment, treatment plan, treatment alternatives and goals were discussed and mutually agreed upon: by patient and by family  PPhineas Semen11/02/2014, 9:08 AM

## 2014-08-31 NOTE — Progress Notes (Signed)
Social Work Assessment and Plan Social Work Assessment and Plan  Patient Details  Name: Joshua Fields MRN: [id number] Date of Birth: 02/01/1951  Today's Date: 08/31/2014  Problem List:  Patient Active Problem List   Diagnosis Date Noted  . Trauma 08/30/2014  . Fracture of tibial shaft, closed   . Fracture of distal end of tibia with fibula   . Fall from tree   . Tibial fracture 08/27/2014  . Abn Findings-GI Tract 08/15/2008  . PULMONARY EMBOLISM 03/17/2008  . DM 03/09/2008  . HYPERLIPIDEMIA 03/09/2008  . HYPERTENSION 03/09/2008  . ALLERGIC RHINITIS 03/09/2008  . HEADACHE, CHRONIC 03/09/2008  . DYSPNEA 03/09/2008   Past Medical History:  Past Medical History  Diagnosis Date  . Diabetes mellitus without complication   . Hypertension   . PE (pulmonary embolism)   . DVT (deep venous thrombosis)   . Hyperlipidemia    Past Surgical History:  Past Surgical History  Procedure Laterality Date  . Cervical fusion  2008  . Back surgery  1982  . Tibia im nail insertion Left 08/27/2014    DR HEWITT  . Tibia im nail insertion Left 08/27/2014    Procedure: INTRAMEDULLARY (IM) NAIL TIBIAL;  Surgeon: [name], MD;  Location: [facility];  Service: Orthopedics;  Laterality: Left;   Social History:  reports that he has never smoked. His smokeless tobacco use includes Snuff. He reports that he does not drink alcohol or use illicit drugs.  Family / Support Systems Marital Status: Married Patient Roles: Spouse, Parent Spouse/Significant Other: Burna MortimerWanda [phone]-cell Children: Tabatha Hollins-daughter  [phone]-cell Other Supports: Melissa Clum-daughter  [phone]-cell Anticipated Caregiver: Wife Ability/Limitations of Caregiver: Wife is small in stature and needs pt to be supervision level atleast-hoping for mod/i Caregiver Availability: 24/7 Family Dynamics: Close knit family wife is currently staying with him while he is here, so she doesn't have to drive back [place] to [place].  They have  grown children who are involved and supportive.  They work but will help if they can  Social History Preferred language: English Religion: Baptist Cultural Background: No issues Education: [employer] Read: Yes Write: Yes Employment Status: Disabled Fish farm managerLegal Hisotry/Current Legal Issues: No issues Guardian/Conservator: None-according to MD pt is capable of making his own decisions while here   Abuse/Neglect Physical Abuse: Denies Verbal Abuse: Denies Sexual Abuse: Denies Exploitation of patient/patient's resources: Denies Self-Neglect: Denies  Emotional Status Pt's affect, behavior adn adjustment status: Pt is joking and talking aobut wanting steak and potatoes while here.  He has always been independent and wants to recover and regain this.  He is aware it will take his limb time to heal and he will need to be pateint.  He reports; " That is not one of my strong suits, patience." Recent Psychosocial Issues: Other medical issues-history of back issues but was independent Pyschiatric History: No history deferred depression screening due to pt declined and feels he is doing well with all of this.  Will monitor while here and have neuro-psych see if necessary Substance Abuse History: No issues  Patient / Family Perceptions, Expectations & Goals Pt/Family understanding of illness & functional limitations: Pt and wif ehave a good understanding of his condition and fractures and treatment plan.  He is relieved he was not hurt worse and didnt break his neck falling.  He has no plans to get back in a tree stand or platform.  He has had several falls prior to this one.  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, Father, retiree, Home owner, etc Anticipated changes in roles/activities/participation: resume Pt/family expectations/goals: Pt states; ' I hope to do for myself."  Wife states: " Look at me I can't help him much I'm little compared to  him."  [employer] IncCommunity Resources Community Agencies: None Premorbid Home Care/DME Agencies: None Transportation available at discharge: Family  Discharge Planning Living Arrangements: Spouse/significant other Support Systems: Spouse/significant other, Children, Other relatives, Friends/neighbors Type of Residence: Private residence Community education officernsurance Resources: [employer] EntertainmentMedicare, Media plannerrivate Insurance (specify) Production manager([employer]) Financial Resources: SSD Financial Screen Referred: No Living Expenses: Lives with family Money Management: Spouse, Patient Does the patient have any problems obtaining your medications?: No Home Management: Wife does inside work he would do the outside work Associate Professoratient/Family Preliminary Plans: Return home with wife assisting him, if reasonable.  They have grown chidlren who are supportive who will help after work.  Pt needs to be atleast supervision-mod/i level due to the size difference between he and wife.  Will encourage her to attend therapies to observe while here. Social Work Anticipated Follow Up Needs: HH/OP  Clinical Impression Pleasant gentleman who is joking about the hospital food and wife who thinks she lost her pocket book and is currently looking for it.  Pt is aware he needs to be mod/i-supervision level to return home and is Confident he will get there while here.  Will work on discharge plans and provide support while here.  Lucy Chrisupree, Pasqualina Colasurdo G 08/31/2014, 2:09 PM

## 2014-08-31 NOTE — Progress Notes (Signed)
During therapy, patient unable to stand without experiencing an episode of diarrhea; patient also complains of mild abdominal pain.  Diarrhea is clear in color.  Deatra Inaan Angiulli, PA notified of patient symptoms.  New orders received.  Will continue to monitor.

## 2014-08-31 NOTE — Progress Notes (Signed)
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 There was no loss of consciousness. INR on admission of 1.89.Cranial CT scan negative.CT abdomen and pelvis with numerous rib fractures and several compression fractures in the spine appear to be chronic and not acute in nature.X-rays and imaging showed a closed displaced left tibial shaft fracture as well as closed displaced comminuted left distal fibular fracture.Underwent open treatment of left tibial shaft fracture with intramedullary nailing closed treatment of left distal fibular fracture 08/28/2014 per Dr. Doran Durand.Weightbearing as tolerated left lower extremity with cam boot   Subjective/Complaints:   Objective: Vital Signs: Blood pressure 114/60, pulse 63, temperature 98.5 F (36.9 C), temperature source Oral, resp. rate 18, height 5' 7"  (1.702 m), weight 119.7 kg (263 lb 14.3 oz), SpO2 97 %. No results found. Results for orders placed or performed during the hospital encounter of 08/30/14 (from the past 72 hour(s))  Glucose, capillary     Status: Abnormal   Collection Time: 08/30/14  9:54 PM  Result Value Ref Range   Glucose-Capillary 129 (H) 70 - 99 mg/dL  Protime-INR     Status: Abnormal   Collection Time: 08/31/14  4:30 AM  Result Value Ref Range   Prothrombin Time 18.9 (H) 11.6 - 15.2 seconds   INR 1.57 (H) 0.00 - 1.49  CBC WITH DIFFERENTIAL     Status: Abnormal   Collection Time: 08/31/14  4:30 AM  Result Value Ref Range   WBC 5.0 4.0 - 10.5 K/uL   RBC 3.09 (L) 4.22 - 5.81 MIL/uL   Hemoglobin 10.0 (L) 13.0 - 17.0 g/dL   HCT 29.9 (L) 39.0 - 52.0 %   MCV 96.8 78.0 - 100.0 fL   MCH 32.4 26.0 - 34.0 pg   MCHC 33.4 30.0 - 36.0 g/dL   RDW 13.4 11.5 - 15.5 %   Platelets 154 150 - 400 K/uL   Neutrophils Relative % 65 43 - 77 %   Neutro Abs 3.2 1.7 - 7.7 K/uL   Lymphocytes Relative 20 12 - 46 %   Lymphs Abs 1.0 0.7 - 4.0 K/uL   Monocytes Relative 9 3 - 12 %   Monocytes Absolute 0.4 0.1 - 1.0 K/uL   Eosinophils Relative 6 (H) 0 - 5 %   Eosinophils Absolute 0.3 0.0 - 0.7 K/uL   Basophils Relative 0 0 - 1 %   Basophils Absolute 0.0 0.0 - 0.1 K/uL  Comprehensive metabolic panel     Status: Abnormal   Collection Time: 08/31/14  4:30 AM  Result Value Ref Range   Sodium 141 137 - 147 mEq/L   Potassium 3.5 (L) 3.7 - 5.3 mEq/L   Chloride 97 96 - 112 mEq/L   CO2 32 19 - 32 mEq/L   Glucose, Bld 132 (H) 70 - 99 mg/dL   BUN 12 6 - 23 mg/dL   Creatinine, Ser 0.76 0.50 - 1.35 mg/dL   Calcium 8.7 8.4 - 10.5 mg/dL   Total Protein 5.8 (L) 6.0 - 8.3 g/dL   Albumin 3.0 (L) 3.5 - 5.2 g/dL   AST 27 0 - 37 U/L   ALT 24 0 - 53 U/L   Alkaline Phosphatase 52 39 - 117 U/L   Total Bilirubin 0.5 0.3 - 1.2 mg/dL   GFR calc non Af Amer >90 >90 mL/min  GFR calc Af Amer >90 >90 mL/min    Comment: (NOTE) The eGFR has been calculated using the CKD EPI equation. This calculation has not been validated in all clinical situations. eGFR's persistently <90 mL/min signify possible Chronic Kidney Disease.    Anion gap 12 5 - 15  Glucose, capillary     Status: Abnormal   Collection Time: 08/31/14  6:40 AM  Result Value Ref Range   Glucose-Capillary 119 (H) 70 - 99 mg/dL     HEENT: normal Cardio: RRR and no murmur Resp: CTA B/L and unlabored GI: BS positive and NT,ND Extremity:  Pulses positive and No Edema Skin:   Other ACE on LLE Neuro: Alert/Oriented and Abnormal Motor 5/5 in BUE, 4+/5 RLE, 2- R HF, 3- KE and ankle DF limited by pain Musc/Skel:  Swelling Left toes, pain with AROM LLE GEN  NAD   Assessment/Plan: 1. Functional deficits secondary to Left tib/fib fractures  which require 3+ hours per day of interdisciplinary therapy in a comprehensive inpatient rehab setting. Physiatrist is  providing close team supervision and 24 hour management of active medical problems listed below. Physiatrist and rehab team continue to assess barriers to discharge/monitor patient progress toward functional and medical goals. FIM:                   Comprehension Comprehension Mode: Auditory Comprehension: 5-Understands complex 90% of the time/Cues < 10% of the time  Expression Expression Mode: Verbal Expression: 7-Expresses complex ideas: With no assist  Social Interaction Social Interaction Mode: Asleep Social Interaction: 7-Interacts appropriately with others - No medications needed.  Problem Solving Problem Solving: 7-Solves complex problems: Recognizes & self-corrects  Memory Memory Assistive Devices: Memory book Memory: 6-More than reasonable amt of time  Medical Problem List and Plan: 1. Functional deficits secondary to Multitrauma after fall. Closed displaced left tibial shaft fracture with displaced comminuted left distal fibula fracture. Status post IM nailing. Weightbearing as tolerated 2.  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 Monitor for bleeding episodes 3. Pain Management: Lyrica 50 mg 3 times a day, oxycodone as needed. Monitor with increased mobility 4. Mood: generally appropriate. Team to provide ego support as possible 5. Neuropsych: This patient is  capable of making decisions on his own behalf. 6. Skin/Wound Care: routine skin checks 7. Fluids/Electrolytes/Nutrition: Strict INO's. Follow-up chemistries. Provide nutritional supplements as needed 8. Diabetes mellitus with peripheral neuropathy. Amaryl 1 mg daily. Check blood sugar before meals and at bedtime. 9.Hypertension. Maxide 37.5-25 mg  Daily. Monitor with increased mobility 10.Hyperlipidemia. Continue Vytorin   LOS (Days) 1 A FACE TO FACE EVALUATION WAS PERFORMED  KIRSTEINS,ANDREW E 08/31/2014, 7:25 AM

## 2014-08-31 NOTE — Evaluation (Signed)
Physical Therapy Assessment and Plan  Patient Details  Name: Joshua Fields MRN: [id number] Date of Birth: 1951/07/23  PT Diagnosis: Abnormal posture, Abnormality of gait, Difficulty walking, Muscle weakness and Pain in LLE Rehab Potential: Excellent ELOS: 7-9 days    Today's Date: 08/31/2014 PT Individual Time: 0930-1030 PT Individual Time Calculation (min): 60 min    Problem List:  Patient Active Problem List   Diagnosis Date Noted  . Trauma 08/30/2014  . Fracture of tibial shaft, closed   . Fracture of distal end of tibia with fibula   . Fall from tree   . Tibial fracture 08/27/2014  . Abn Findings-GI Tract 08/15/2008  . PULMONARY EMBOLISM 03/17/2008  . DM 03/09/2008  . HYPERLIPIDEMIA 03/09/2008  . HYPERTENSION 03/09/2008  . ALLERGIC RHINITIS 03/09/2008  . HEADACHE, CHRONIC 03/09/2008  . DYSPNEA 03/09/2008    Past Medical History:  Past Medical History  Diagnosis Date  . Diabetes mellitus without complication   . Hypertension   . PE (pulmonary embolism)   . DVT (deep venous thrombosis)   . Hyperlipidemia    Past Surgical History:  Past Surgical History  Procedure Laterality Date  . Cervical fusion  2008  . Back surgery  1982  . Tibia im nail insertion Left 08/27/2014    DR HEWITT  . Tibia im nail insertion Left 08/27/2014    Procedure: INTRAMEDULLARY (IM) NAIL TIBIAL;  Surgeon: Wylene Simmer, MD;  Location: [facility];  Service: Orthopedics;  Laterality: Left;    Assessment & Plan Clinical Impression: Patient is a 63 y.o. 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 There was no loss of consciousness. INR on admission of 1.89.Cranial CT scan negative.CT abdomen and pelvis with numerous rib fractures and several compression fractures in the  spine appear to be chronic and not acute in nature.X-rays and imaging showed a closed displaced left tibial shaft fracture as well as closed displaced comminuted left distal fibular fracture.Underwent open treatment of left tibial shaft fracture with intramedullary nailing closed treatment of left distal fibular fracture 08/28/2014 per Dr. Doran Durand.Weightbearing as tolerated left lower extremity with cam boot.  Patient transferred to CIR on 08/30/2014 .   Patient currently requires min with mobility secondary to muscle weakness and decreased cardiorespiratoy endurance.  Prior to hospitalization, patient was modified independent  with mobility and lived with Spouse in a House home.  Home access is 4Stairs to enter.  Patient will benefit from skilled PT intervention to maximize safe functional mobility, minimize fall risk and decrease caregiver burden for planned discharge home with 24 hour supervision.  Anticipate patient will benefit from follow up OP at discharge.  PT - End of Session Activity Tolerance: Tolerates 30+ min activity with multiple rests Endurance Deficit: Yes Endurance Deficit Description: Pt fatigued following gait to/from restroom PT Assessment Rehab Potential: Excellent PT Patient demonstrates impairments in the following area(s): Balance;Endurance;Motor;Pain;Safety PT Transfers Functional Problem(s): Bed Mobility;Bed to Chair;Car;Furniture PT Locomotion Functional Problem(s): Ambulation;Wheelchair Mobility;Stairs PT Plan PT Intensity: Minimum of 1-2 x/day ,45 to 90 minutes PT Frequency: 5 out of 7 days PT Duration Estimated Length of Stay: 7-9 days  PT Treatment/Interventions: Ambulation/gait training;Balance/vestibular training;Community reintegration;Discharge planning;DME/adaptive equipment instruction;Functional mobility training;Pain management;Patient/family education;Stair training;Therapeutic Activities;Therapeutic Exercise;UE/LE Strength taining/ROM;Wheelchair  propulsion/positioning PT Transfers Anticipated Outcome(s): mod I  PT Locomotion Anticipated Outcome(s): mod I  PT  Recommendation Follow Up Recommendations: Outpatient PT (esp once cam boot removed) Patient destination: Home Equipment Recommended: To be determined  Skilled Therapeutic Intervention PT assessment and evaluation completed, see full details below.  Eval somewhat limited due to bowel issues, but pt did ambulate to/from restroom with RW at min/guard to min A level.  Provided education and cues for upright posture and placing whole L foot on ground.  Limited due to increased pain.  Pt able to stand while managing clothing at min A level (did require some assist for pants due to urgency, but he was able to perform peri care.  Discussed home set up, education regarding fall risk and how to decrease this at home.  Also discussed ELOS, equipment and rehab schedule.  Pt verbalized understanding.  Ambulated back to bed to sit at EOB while getting w/c.  When therapist returned pt states he needed to lie down due to dizziness.  Assisted into supine with min A and BP checked.  Was 97/64 (likely lower prior to lying down).  PA and RN notified.  Will continue to assess w/c mobility and hopefully stairs this afternoon.   PT Evaluation Precautions/Restrictions Precautions Precautions: Fall Required Braces or Orthoses: Other Brace/Splint Other Brace/Splint: CAM boot when walking Restrictions Weight Bearing Restrictions: Yes LLE Weight Bearing: Weight bearing as tolerated Other Position/Activity Restrictions: with CAM boot (per OP REPORT) General Chart Reviewed: Yes Family/Caregiver Present: Yes (wife present at end of session) Vital SignsTherapy Vitals Pulse Rate: 73 Resp: 18 BP: 97/64 mmHg Patient Position (if appropriate): Lying Oxygen Therapy SpO2: 98 % O2 Device: Nasal Cannula O2 Flow Rate (L/min): 2 L/min Pain Pain Assessment Pain Assessment: 0-10 Pain Score: 5  Pain Type:  Surgical pain Pain Location: Leg Pain Orientation: Left Pain Descriptors / Indicators: Aching Pain Frequency: Constant Pain Onset: On-going Patients Stated Pain Goal: 4 Pain Intervention(s): Medication (See eMAR);Repositioned;Emotional support Multiple Pain Sites: No Home Living/Prior Functioning Home Living Available Help at Discharge: Available 24 hours/day;Family Type of Home: House Home Access: Stairs to enter Entrance Stairs-Number of Steps: 4 Entrance Stairs-Rails: Right;Left;Can reach both Home Layout: One level;Laundry or work area in [employer] With: Spouse Prior Function Level of Independence: Requires assistive device for independence  Able to Take Stairs?: Yes Driving: Yes Vocation: On disability Comments: Pt reports multiple falls at home Vision/Perception     Cognition Overall Cognitive Status: Within Functional Limits for tasks assessed Arousal/Alertness: Suspect due to medications Orientation Level: Oriented X4 Attention: Selective Selective Attention: Appears intact Memory: Appears intact Awareness: Impaired Awareness Impairment: Anticipatory impairment Safety/Judgment: Appears intact Sensation Sensation Light Touch: Appears Intact Stereognosis: Not tested Hot/Cold: Not tested Proprioception: Appears Intact Coordination Gross Motor Movements are Fluid and Coordinated: Yes Fine Motor Movements are Fluid and Coordinated: Yes Heel Shin Test: decreased/delayed due to pain Motor  Motor Motor: Other (comment) Motor - Skilled Clinical Observations: Pt with decreased balance, safety and decreased strength in LLE  Mobility Bed Mobility Bed Mobility: Sit to Supine Sit to Supine: 4: Min assist Sit to Supine - Details: Verbal cues for sequencing;Verbal cues for technique;Verbal cues for precautions/safety;Manual facilitation for placement Transfers Transfers: Yes Sit to Stand: 4: Min guard;4: Min assist Sit to Stand Details: Verbal cues for  sequencing;Verbal cues for technique;Verbal cues for precautions/safety;Manual facilitation for weight shifting Stand to Sit: 4: Min assist;4: Min guard Stand to Sit Details (indicate cue type and reason): Verbal cues for sequencing;Verbal cues for technique;Verbal cues for precautions/safety Stand Pivot Transfers: 4: Min guard;4: Min assist Stand Pivot  Transfer Details: Verbal cues for sequencing;Verbal cues for technique;Verbal cues for precautions/safety Locomotion  Ambulation Ambulation: Yes Ambulation/Gait Assistance: 4: Min assist;4: Min guard Ambulation Distance (Feet): 15 Feet ( x 2 reps) Assistive device: Rolling walker Ambulation/Gait Assistance Details: Verbal cues for sequencing;Verbal cues for technique;Verbal cues for precautions/safety;Manual facilitation for weight shifting;Verbal cues for gait pattern;Verbal cues for safe use of DME/AE Gait Gait: Yes Gait Pattern: Impaired Gait Pattern: Antalgic;Trunk flexed;Narrow base of support;Decreased stance time - left;Decreased step length - left;Decreased stride length;Decreased weight shift to left Stairs / Additional Locomotion Stairs: No (due to bowel issues and low BP) Wheelchair Mobility Wheelchair Mobility: No (due to bowel issues and low BP)  Trunk/Postural Assessment  Cervical Assessment Cervical Assessment: Within Functional Limits Thoracic Assessment Thoracic Assessment: Exceptions to [facility] (forward, rounded shoulders) Lumbar Assessment Lumbar Assessment: Exceptions to [facility] (limited ROM in lower spine) Postural Control Postural Control: Deficits on evaluation Postural Limitations: Pt with decreased flexibility in spine with decreased balance, esp during more dynamic tasks  Balance Balance Balance Assessed: Yes Static Sitting Balance Static Sitting - Balance Support: Feet supported;Bilateral upper extremity supported Static Sitting - Level of Assistance: 6: Modified independent (Device/Increase time) Dynamic Sitting  Balance Dynamic Sitting - Balance Support: Feet supported;Right upper extremity supported Dynamic Sitting - Level of Assistance: 5: Stand by assistance Static Standing Balance Static Standing - Balance Support: During functional activity;Left upper extremity supported;Bilateral upper extremity supported Static Standing - Level of Assistance: 4: Min assist Dynamic Standing Balance Dynamic Standing - Balance Support: Right upper extremity supported;During functional activity Dynamic Standing - Level of Assistance: 4: Min assist Extremity Assessment      RLE Assessment RLE Assessment: Within Functional Limits LLE Assessment LLE Assessment: Exceptions to [facility] LLE Strength LLE Overall Strength: Deficits;Due to pain;Due to precautions LLE Overall Strength Comments: unable to test ankle due to cam boot, but all motions limited due to pain at time of eval, note mild WB during gait  FIM:  FIM - Control and instrumentation engineer Devices: Walker;Arm rests Bed/Chair Transfer: 4: Sit > Supine: Min A (steadying pt. > 75%/lift 1 leg);4: Chair or W/C > Bed: Min A (steadying Pt. > 75%);4: Bed > Chair or W/C: Min A (steadying Pt. > 75%) FIM - Locomotion: Wheelchair Locomotion: Wheelchair: 0: Activity did not occur FIM - Locomotion: Ambulation Locomotion: Ambulation Assistive Devices: Administrator Ambulation/Gait Assistance: 4: Min assist;4: Min guard Locomotion: Ambulation: 1: Travels less than 50 ft with minimal assistance (Pt.>75%) FIM - Locomotion: Stairs Locomotion: Stairs: 0: Activity did not occur   Refer to Care Plan for Long Term Goals  Recommendations for other services: None  Discharge Criteria: Patient will be discharged from PT if patient refuses treatment 3 consecutive times without medical reason, if treatment goals not met, if there is a change in medical status, if patient makes no progress towards goals or if patient is discharged from hospital.  The above  assessment, treatment plan, treatment alternatives and goals were discussed and mutually agreed upon: by patient and by family  Denice Bors 08/31/2014, 12:44 PM

## 2014-08-31 NOTE — Plan of Care (Signed)
Problem: RH BLADDER ELIMINATION Goal: RH STG MANAGE BLADDER WITH ASSISTANCE STG Manage Bladder With Modified indepedence  Outcome: Completed/Met Date Met:  08/31/14  Problem: RH SKIN INTEGRITY Goal: RH STG SKIN FREE OF INFECTION/BREAKDOWN Patient will have no new skin breakdown/infection while on rehab with min assist of caregiver  Outcome: Progressing Goal: RH STG MAINTAIN SKIN INTEGRITY WITH ASSISTANCE STG Maintain Skin Integrity With [place].  Outcome: Progressing  Problem: RH SAFETY Goal: RH STG ADHERE TO SAFETY PRECAUTIONS W/ASSISTANCE/DEVICE STG Adhere to Safety Precautions With Min Assist and Assistance/Device.  Outcome: Progressing

## 2014-08-31 NOTE — Progress Notes (Addendum)
Physical Therapy Session Note  Patient Details  Name: Joshua Fields MRN: [name] Date of Birth: 03/10/1951  Today's Date: 08/31/2014 PT Individual Time: 1430-1504 PT Individual Time Calculation (min): 34 min   Short Term Goals: Week 1:  PT Short Term Goal 1 (Week 1): =LTGs due to ELOS  Skilled Therapeutic Interventions/Progress Updates:  Pt received lying in bed, agreeable to therapy.  Performed bed mobility at min/guard level for safety of LLE out of bed.  Once at EOB, provided total A in donning Cam boot for proper fit.  Pt not wearing O2 when PT entered room, therefore had nurse tech check vitals.  Note that BP was 101/48 and SaO2 was ranging from 92 down to 88%.  Cues for pursed lip breathing.  Transferred to w/c with RW at min/guard level with cues for stepping sequence and safety with RW.  Pt then propelled w/c using BUEs at S level.  Min cues for safety when making turns.  Pt then states he is dizzy again.  Checked vitals in gym and BP was still 101/48.  RN made aware.  Did not attempt stairs for safety due to low BP.  Pt propelled back to room and requesting to use restroom.  Ambulated approx 8' to restroom with RW at close S to min/guard level.  Donned 2L O2 for safety during mobility.  Pt able to adjust clothing at min/guard level.  Pt left on toilet with nurse tech present in room.   Therapy Documentation Precautions:  Precautions Precautions: Fall Required Braces or Orthoses: Other Brace/Splint Other Brace/Splint: CAM boot when walking Restrictions Weight Bearing Restrictions: Yes LLE Weight Bearing: Weight bearing as tolerated Other Position/Activity Restrictions: with CAM boot (per OP REPORT) General:   Vital Signs: Therapy Vitals Temp: 98.6 F (37 C) Temp Source: Oral Pulse Rate: 74 Resp: 19 BP: (!) 101/48 mmHg Patient Position (if appropriate): Sitting Oxygen Therapy SpO2: 90 % O2 Device: Not Delivered Pain: Pain Assessment Pain Assessment: 0-10 Pain Score: 4   Pain Type: Surgical pain Pain Location: Leg Pain Orientation: Left Pain Descriptors / Indicators: Aching Pain Frequency: Constant Pain Onset: On-going Patients Stated Pain Goal: 4 Pain Intervention(s): Medication (See eMAR);Repositioned;Emotional support Multiple Pain Sites: No  See FIM for current functional status  Therapy/Group: Individual Therapy  [name], [name] 08/31/2014, 4:41 PM

## 2014-08-31 NOTE — Progress Notes (Signed)
Patient complaining of feeling dizzy and "seeing fuzzies" while sitting on the side of the bed with therapy.  Patient put supine in bed; vitals obtained.  Deatra Inaan Angiulli, PA at bedside and aware of symptoms and vital signs.  No new orders received.  Will continue to monitor.

## 2014-08-31 NOTE — Progress Notes (Signed)
ANTICOAGULATION CONSULT NOTE - Follow Up Consult  Pharmacy Consult for coumadin Indication: hx of DVT/PE  No Known Allergies  Patient Measurements: Height: 5\' 7"  (170.2 cm) Weight: 263 lb 14.3 oz (119.7 kg) IBW/kg (Calculated) : 66.1 Heparin Dosing Weight:   Vital Signs: Temp: 98.5 F (36.9 C) (11/05 0504) BP: 114/60 mmHg (11/05 0504) Pulse Rate: 63 (11/05 0504)  Labs:  Recent Labs  08/29/14 0647 08/30/14 0524 08/31/14 0430  HGB  --  10.2* 10.0*  HCT  --  31.0* 29.9*  PLT  --  146* 154  LABPROT 19.3* 18.1* 18.9*  INR 1.61* 1.49 1.57*  CREATININE  --   --  0.76    Estimated Creatinine Clearance: 117 mL/min (by C-G formula based on Cr of 0.76).   Medications:  Scheduled:  . antiseptic oral rinse  7 mL Mouth Rinse BID  . azelastine  1 spray Each Nare Daily   And  . fluticasone  1 spray Each Nare Daily  . ezetimibe-simvastatin  1 tablet Oral Daily  . glimepiride  1 mg Oral Q breakfast  . insulin aspart  0-15 Units Subcutaneous TID WC  . pregabalin  50 mg Oral TID  . triamterene-hydrochlorothiazide  1 tablet Oral Daily  . Warfarin - Pharmacist Dosing Inpatient   Does not apply q1800   Infusions:    Assessment: 63 yo male with hx of DVT/PE and now s/p ortho surgery is currently on subtherapeutic couamdin.  INR today is up to 1.57.  Goal of Therapy:  INR 2-3 Monitor platelets by anticoagulation protocol: Yes   Plan:  - coumadin 8 mg po x1 - INR in am  Imagine Nest, Tsz-Yin 08/31/2014,8:15 AM

## 2014-09-01 ENCOUNTER — Inpatient Hospital Stay (HOSPITAL_COMMUNITY): Payer: Medicare Other | Admitting: Occupational Therapy

## 2014-09-01 ENCOUNTER — Inpatient Hospital Stay (HOSPITAL_COMMUNITY): Payer: PRIVATE HEALTH INSURANCE | Admitting: Rehabilitation

## 2014-09-01 DIAGNOSIS — K5901 Slow transit constipation: Secondary | ICD-10-CM

## 2014-09-01 LAB — PROTIME-INR
INR: 2.02 — AB (ref 0.00–1.49)
PROTHROMBIN TIME: 23 s — AB (ref 11.6–15.2)

## 2014-09-01 LAB — GLUCOSE, CAPILLARY
GLUCOSE-CAPILLARY: 112 mg/dL — AB (ref 70–99)
GLUCOSE-CAPILLARY: 47 mg/dL — AB (ref 70–99)
GLUCOSE-CAPILLARY: 86 mg/dL (ref 70–99)
Glucose-Capillary: 113 mg/dL — ABNORMAL HIGH (ref 70–99)
Glucose-Capillary: 121 mg/dL — ABNORMAL HIGH (ref 70–99)

## 2014-09-01 MED ORDER — DOCUSATE SODIUM 100 MG PO CAPS
100.0000 mg | ORAL_CAPSULE | Freq: Two times a day (BID) | ORAL | Status: DC
  Administered 2014-09-01 – 2014-09-08 (×15): 100 mg via ORAL
  Filled 2014-09-01 (×18): qty 1

## 2014-09-01 MED ORDER — WARFARIN SODIUM 4 MG PO TABS
4.0000 mg | ORAL_TABLET | Freq: Once | ORAL | Status: AC
  Administered 2014-09-01: 4 mg via ORAL
  Filled 2014-09-01: qty 1

## 2014-09-01 NOTE — Progress Notes (Signed)
Social Work Patient ID: Joshua Fields, male   DOB: Nov 12, 1950, 63 y.o.   MRN: [id number] Met with pt to discuss use of O2 here.  Wife will need to use her own tank she has here so pt can use the wall unit.  He states: " She left it in the [place] and I told her to just use mine., I don't need it anyway." The portable tank wife has does regenerate itself she just needs to recharge it, which she can in their [place] which is here.  Pt is aware he is our patient and our staff can not be responsible for wife's care. He is aware and states; "She can manage herself."  Will work on discharge plans, pt feels better today. Have had the discussion and will see what happens, can not guarantee wife will not sue pt's O2.

## 2014-09-01 NOTE — Plan of Care (Signed)
Problem: RH SKIN INTEGRITY Goal: RH STG SKIN FREE OF INFECTION/BREAKDOWN Patient will have no new skin breakdown/infection while on rehab with min assist of caregiver  Outcome: Progressing     

## 2014-09-01 NOTE — Plan of Care (Signed)
Problem: RH PAIN MANAGEMENT Goal: RH STG PAIN MANAGED AT OR BELOW PT'S PAIN GOAL 3 or less on scale of 1-10  Outcome: Not Progressing Report pain as an 8

## 2014-09-01 NOTE — Progress Notes (Signed)
Patient information reviewed and entered into [facility] by Becky Yoshito Gaza, covering PPS coordinator. Information including medical coding and functional independence measure will be reviewed and updated through discharge.  Per nursing, patient was given "Data Collection Information Summary for Patients in Inpatient Rehabilitation Facilities with attached Privacy Act Statement Health Care Records" upon admission.     

## 2014-09-01 NOTE — Plan of Care (Signed)
Problem: RH BOWEL ELIMINATION Goal: RH STG MANAGE BOWEL WITH ASSISTANCE STG Manage Bowel with modified independence  Outcome: Progressing Pt continent

## 2014-09-01 NOTE — Progress Notes (Signed)
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 There was no loss of consciousness. INR on admission of 1.89.Cranial CT scan negative.CT abdomen and pelvis with numerous rib fractures and several compression fractures in the spine appear to be chronic and not acute in nature.X-rays and imaging showed a closed displaced left tibial shaft fracture as well as closed displaced comminuted left distal fibular fracture.Underwent open treatment of left tibial shaft fracture with intramedullary nailing closed treatment of left distal fibular fracture 08/28/2014 per Dr. Doran Durand.Weightbearing as tolerated left lower extremity with cam boot   Subjective/Complaints: No issues overnite except vomited x 1, has been constipated  Objective: Vital Signs: Blood pressure 102/61, pulse 64, temperature 98.3 F (36.8 C), temperature source Oral, resp. rate 18, height _0  (1.702 m), weight 119.7 kg (263 lb 14.3 oz), SpO2 97 %. Dg Abd 1 View  08/31/2014   CLINICAL DATA:  Abdominal pain, nausea, diarrhea  EXAM: ABDOMEN - 1 VIEW  COMPARISON:  None.  FINDINGS: Nonspecific bowel gas pattern with multiple nondilated loops of small bowel in the central abdomen. Colon is non-decompressed with a moderate colonic stool burden.  Degenerative changes of the lumbar spine with lower lumbar spine fixation hardware.  Thoracic spine stimulator.  IMPRESSION: Nonspecific but nonobstructive bowel gas pattern.  Moderate colonic stool burden.   Electronically Signed   By: Julian Hy M.D.   On: 08/31/2014 11:13   Results for orders placed or performed during the hospital encounter of 08/30/14 (from the past 72 hour(s))  Glucose, capillary     Status: Abnormal   Collection Time: 08/30/14  9:54 PM   Result Value Ref Range   Glucose-Capillary 129 (H) 70 - 99 mg/dL  Protime-INR     Status: Abnormal   Collection Time: 08/31/14  4:30 AM  Result Value Ref Range   Prothrombin Time 18.9 (H) 11.6 - 15.2 seconds   INR 1.57 (H) 0.00 - 1.49  CBC WITH DIFFERENTIAL     Status: Abnormal   Collection Time: 08/31/14  4:30 AM  Result Value Ref Range   WBC 5.0 4.0 - 10.5 K/uL   RBC 3.09 (L) 4.22 - 5.81 MIL/uL   Hemoglobin 10.0 (L) 13.0 - 17.0 g/dL   HCT 29.9 (L) 39.0 - 52.0 %   MCV 96.8 78.0 - 100.0 fL   MCH 32.4 26.0 - 34.0 pg   MCHC 33.4 30.0 - 36.0 g/dL   RDW 13.4 11.5 - 15.5 %   Platelets 154 150 - 400 K/uL   Neutrophils Relative % 65 43 - 77 %   Neutro Abs 3.2 1.7 - 7.7 K/uL   Lymphocytes Relative 20 12 - 46 %   Lymphs Abs 1.0 0.7 - 4.0 K/uL   Monocytes Relative 9 3 - 12 %   Monocytes Absolute 0.4 0.1 - 1.0 K/uL   Eosinophils Relative 6 (H) 0 - 5 %   Eosinophils Absolute 0.3 0.0 - 0.7 K/uL   Basophils Relative 0 0 - 1 %   Basophils Absolute 0.0 0.0 - 0.1 K/uL  Comprehensive metabolic panel     Status: Abnormal   Collection Time: 08/31/14  4:30 AM  Result Value Ref Range   Sodium 141 137 - 147 mEq/L   Potassium 3.5 (L) 3.7 - 5.3 mEq/L   Chloride 97 96 - 112 mEq/L  CO2 32 19 - 32 mEq/L   Glucose, Bld 132 (H) 70 - 99 mg/dL   BUN 12 6 - 23 mg/dL   Creatinine, Ser 0.76 0.50 - 1.35 mg/dL   Calcium 8.7 8.4 - 10.5 mg/dL   Total Protein 5.8 (L) 6.0 - 8.3 g/dL   Albumin 3.0 (L) 3.5 - 5.2 g/dL   AST 27 0 - 37 U/L   ALT 24 0 - 53 U/L   Alkaline Phosphatase 52 39 - 117 U/L   Total Bilirubin 0.5 0.3 - 1.2 mg/dL   GFR calc non Af Amer >90 >90 mL/min   GFR calc Af Amer >90 >90 mL/min    Comment: (NOTE) The eGFR has been calculated using the CKD EPI equation. This calculation has not been validated in all clinical situations. eGFR's persistently <90 mL/min signify possible Chronic Kidney Disease.    Anion gap 12 5 - 15  Glucose, capillary     Status: Abnormal   Collection Time:  08/31/14  6:40 AM  Result Value Ref Range   Glucose-Capillary 119 (H) 70 - 99 mg/dL  Glucose, capillary     Status: Abnormal   Collection Time: 08/31/14 11:16 AM  Result Value Ref Range   Glucose-Capillary 114 (H) 70 - 99 mg/dL  Glucose, capillary     Status: Abnormal   Collection Time: 08/31/14  1:43 PM  Result Value Ref Range   Glucose-Capillary 113 (H) 70 - 99 mg/dL   Comment 1 Notify RN   Glucose, capillary     Status: Abnormal   Collection Time: 08/31/14  5:11 PM  Result Value Ref Range   Glucose-Capillary 105 (H) 70 - 99 mg/dL   Comment 1 Notify RN   Glucose, capillary     Status: Abnormal   Collection Time: 08/31/14  8:35 PM  Result Value Ref Range   Glucose-Capillary 122 (H) 70 - 99 mg/dL  Protime-INR     Status: Abnormal   Collection Time: 09/01/14  5:48 AM  Result Value Ref Range   Prothrombin Time 23.0 (H) 11.6 - 15.2 seconds   INR 2.02 (H) 0.00 - 1.49  Glucose, capillary     Status: Abnormal   Collection Time: 09/01/14  6:37 AM  Result Value Ref Range   Glucose-Capillary 112 (H) 70 - 99 mg/dL     HEENT: normal Cardio: RRR and no murmur Resp: CTA B/L and unlabored GI: BS positive and NT,ND, soft Extremity:  Pulses positive and No Edema Skin:   Other ACE on LLE Neuro: Alert/Oriented and Abnormal Motor 5/5 in BUE, 4+/5 RLE, 2- R HF, 3- KE and ankle DF limited by pain Musc/Skel:  Swelling Left toes, pain with AROM LLE GEN  NAD   Assessment/Plan: 1. Functional deficits secondary to Left tib/fib fractures  which require 3+ hours per day of interdisciplinary therapy in a comprehensive inpatient rehab setting. Physiatrist is providing close team supervision and 24 hour management of active medical problems listed below. Physiatrist and rehab team continue to assess barriers to discharge/monitor patient progress toward functional and medical goals. FIM: FIM - Bathing Bathing Steps Patient Completed: Chest, Right Arm, Left Arm, Abdomen, Front perineal area,  Buttocks, Right upper leg, Left upper leg Bathing: 4: Min-Patient completes 8-9 65f10 parts or 75+ percent  FIM - Upper Body Dressing/Undressing Upper body dressing/undressing steps patient completed: Thread/unthread right sleeve of pullover shirt/dresss, Thread/unthread left sleeve of pullover shirt/dress, Put head through opening of pull over shirt/dress, Pull shirt over trunk Upper body dressing/undressing: 5:  Set-up assist to: Obtain clothing/put away FIM - Lower Body Dressing/Undressing Lower body dressing/undressing steps patient completed: Thread/unthread right pants leg, Don/Doff right shoe Lower body dressing/undressing: 2: Max-Patient completed 25-49% of tasks  FIM - Toileting Toileting steps completed by patient: Adjust clothing prior to toileting Toileting Assistive Devices: Grab bar or rail for support Toileting: 2: Max-Patient completed 1 of 3 steps  FIM - Radio producer Devices: Environmental consultant, Product manager Transfers: 4-To toilet/BSC: Min A (steadying Pt. > 75%), 4-From toilet/BSC: Min A (steadying Pt. > 75%)  FIM - Bed/Chair Transfer Bed/Chair Transfer Assistive Devices: Walker, Arm rests Bed/Chair Transfer: 4: Sit > Supine: Min A (steadying pt. > 75%/lift 1 leg), 4: Chair or W/C > Bed: Min A (steadying Pt. > 75%), 4: Bed > Chair or W/C: Min A (steadying Pt. > 75%)  FIM - Locomotion: Wheelchair Distance: 100 Locomotion: Wheelchair: 2: Travels 50 - 149 ft with supervision, cueing or coaxing FIM - Locomotion: Ambulation Locomotion: Ambulation Assistive Devices: Administrator Ambulation/Gait Assistance: 4: Min assist, 4: Min guard Locomotion: Ambulation: 1: Travels less than 50 ft with minimal assistance (Pt.>75%)  Comprehension Comprehension Mode: Auditory Comprehension: 5-Follows basic conversation/direction: With extra time/assistive device  Expression Expression Mode: Verbal Expression: 5-Expresses basic 90% of the time/requires cueing  < 10% of the time.  Social Interaction Social Interaction Mode: Asleep Social Interaction: 5-Interacts appropriately 90% of the time - Needs monitoring or encouragement for participation or interaction.  Problem Solving Problem Solving: 5-Solves basic 90% of the time/requires cueing < 10% of the time  Memory Memory Assistive Devices: Memory book Memory: 4-Recognizes or recalls 75 - 89% of the time/requires cueing 10 - 24% of the time  Medical Problem List and Plan: 1. Functional deficits secondary to Multitrauma after fall. Closed displaced left tibial shaft fracture with displaced comminuted left distal fibula fracture. Status post IM nailing. Weightbearing as tolerated 2.  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 Monitor for bleeding episodes 3. Pain Management: Lyrica 50 mg 3 times a day, oxycodone as needed. Monitor with increased mobility 4. Mood: generally appropriate. Team to provide ego support as possible 5. Neuropsych: This patient is  capable of making decisions on his own behalf. 6. Skin/Wound Care: routine skin checks 7. Fluids/Electrolytes/Nutrition: Strict INO's. Follow-up chemistries. Provide nutritional supplements as needed 8. Diabetes mellitus with peripheral neuropathy. Amaryl 1 mg daily. Check blood sugar before meals and at bedtime. 9.Hypertension. Maxide 37.5-25 mg  Daily. Monitor with increased mobility 10.Hyperlipidemia. Continue Vytorin 11. Constipation , adjust bowel program, no evidence of ileus on exam or xray  LOS (Days) 2 A FACE TO FACE EVALUATION WAS PERFORMED  Ruchi Stoney E 09/01/2014, 7:20 AM

## 2014-09-01 NOTE — IPOC Note (Signed)
Overall Plan of Care [facility](IPOC) Patient Details Name: Joshua SledgeGary E Lucchesi MRN: [id number] DOB: 06/01/1951  Admitting Diagnosis: L Tibia Fx  Hospital Problems: Active Problems:   Trauma   Fracture of tibial shaft, closed   Fracture of distal end of tibia with fibula   Fall from tree     Functional Problem List: Nursing Bladder, Bowel, Edema, Endurance, Medication Management, Pain, Safety, Skin Integrity  PT Balance, Endurance, Motor, Pain, Safety  OT Balance, Endurance, Motor, Pain, Safety  SLP    TR         Basic ADL's: OT Grooming, Bathing, Dressing, Toileting     Advanced  ADL's: OT Other (comment) (n/a)     Transfers: PT Bed Mobility, Bed to Chair, Car, Occupational psychologisturniture  OT Toilet, Research scientist (life sciences)Tub/Shower     Locomotion: PT Ambulation, Psychologist, prison and probation servicesWheelchair Mobility, Stairs     Additional Impairments: OT None  SLP        TR      Anticipated Outcomes Item Anticipated Outcome  Self Feeding n/a  Swallowing      Basic self-care  Mod I  Toileting  Mod I   Bathroom Transfers toilet transfer with Mod I and shower transfer with supervision for safety  Bowel/Bladder  continent of bowel and bladder modified independence  Transfers  mod I   Locomotion  mod I   Communication     Cognition     Pain  3 or less on scale of 1-10  Safety/Judgment  supervision   Therapy Plan: PT Intensity: Minimum of 1-2 x/day ,45 to 90 minutes PT Frequency: 5 out of 7 days PT Duration Estimated Length of Stay: 7-9 days  OT Intensity: Minimum of 1-2 x/day, 45 to 90 minutes OT Frequency: 5 out of 7 days OT Duration/Estimated Length of Stay: 7-9 days         Team Interventions: Nursing Interventions Patient/Family Education, Bladder Management, Bowel Management, Disease Management/Prevention, Pain Management, Medication Management, Skin Care/Wound Management, Discharge Planning, Psychosocial Support  PT interventions Ambulation/gait training, Warden/rangerBalance/vestibular training, Community reintegration, Discharge  planning, DME/adaptive equipment instruction, Functional mobility training, Pain management, Patient/family education, Stair training, Therapeutic Activities, Therapeutic Exercise, UE/LE Strength taining/ROM, Wheelchair propulsion/positioning  OT Interventions Warden/rangerBalance/vestibular training, Discharge planning, Pain management, Self Care/advanced ADL retraining, Therapeutic Activities, Functional mobility training, Patient/family education, Therapeutic Exercise, UE/LE Strength taining/ROM, Psychosocial support, DME/adaptive equipment instruction, Community reintegration  SLP Interventions    TR Interventions    SW/CM Interventions Discharge Planning, Facilities managersychosocial Support, Patient/Family Education    Team Discharge Planning: Destination: PT-Home ,OT- Home , SLP-  Projected Follow-up: PT-Outpatient PT (esp once cam boot removed), OT-  Outpatient OT, SLP-  Projected Equipment Needs: PT-To be determined, OT- Tub/shower bench, SLP-  Equipment Details: PT- , OT-Pt reports owning several wheelchairs and RWs Patient/family involved in discharge planning: PT- Patient,  OT-Patient, Family member/caregiver, SLP-   MD ELOS: 7-10 days Medical Rehab Prognosis:  Excellent Assessment: The patient has been admitted for CIR therapies with the diagnosis of left tibial shaft and left distal tib/fib fracture. The team will be addressing functional mobility, strength, stamina, balance, safety, adaptive techniques and equipment, self-care, bowel and bladder mgt, patient and caregiver education, pain mgt, ortho precautions, community reintegration. Goals have been set at mod I for basic mobility and self-care.    Ranelle OysterZachary T. Swartz, MD, FAAPMR      See Team Conference Notes for weekly updates to the plan of care

## 2014-09-01 NOTE — Progress Notes (Signed)
Physical Therapy Session Note  Patient Details  Name: Joshua Fields MRN: [id number] Date of Birth: 04/25/1951  Today's Date: 09/01/2014 PT Individual Time: [id number] PT Individual Time Calculation (min): 32 min   Short Term Goals: Week 1:  PT Short Term Goal 1 (Week 1): =LTGs due to ELOS  Skilled Therapeutic Interventions/Progress Updates:   Pt received sitting in recliner, agreeable to therapy session.  Ambulated from room approx 25' in hallway with RW at min/guard to close S level.  Continued cues for upright posture, increasing whole L foot contact during stance, and for ease of step on RLE.  Pt requesting to sit due to dizziness and being sweaty.  BP was 122/63, SaO2 93%.  Pt self propelled remainder of distance to therapy gym at S level with BUEs.  Once in therapy gym, negotiated up 3, 4" steps and down 2, 6" steps with B handrails at min A level.  Demonstration and verbal cues for sequencing and technique with min A to steady throughout with cues for pursed lip breathing.  Pt with increasing pain and RN in room to provide pain meds.  Assisted pt to recliner with RW at close S level.  Pt left with all needs in reach.    Therapy Documentation Precautions:  Precautions Precautions: Fall Required Braces or Orthoses: Other Brace/Splint Other Brace/Splint: CAM boot when walking Restrictions Weight Bearing Restrictions: Yes LLE Weight Bearing: Weight bearing as tolerated Other Position/Activity Restrictions: with CAM boot (per OP REPORT)   Vital Signs: Therapy Vitals Temp: 98.3 F (36.8 C) Temp Source: Oral Pulse Rate: 64 Resp: 18 BP: 102/61 mmHg Patient Position (if appropriate): Lying Oxygen Therapy SpO2: 97 % O2 Device: Not Delivered Pain: Pain Assessment Pain Score: 9  Locomotion : Ambulation Ambulation/Gait Assistance: 4: Min guard;5: Supervision Wheelchair Mobility Distance: 100   See FIM for current functional status  Therapy/Group: Individual Therapy  [name],  [name] 09/01/2014, 10:00 AM

## 2014-09-01 NOTE — Progress Notes (Signed)
ANTICOAGULATION CONSULT NOTE - Follow Up Consult  Pharmacy Consult for coumadin Indication: hx of DVT/PE  No Known Allergies  Patient Measurements: Height: 5\' 7"  (170.2 cm) Weight: 263 lb 14.3 oz (119.7 kg) IBW/kg (Calculated) : 66.1 Heparin Dosing Weight:   Vital Signs: Temp: 98.3 F (36.8 C) (11/06 0607) Temp Source: Oral (11/06 0607) BP: 102/61 mmHg (11/06 0607) Pulse Rate: 64 (11/06 0607)  Labs:  Recent Labs  08/30/14 0524 08/31/14 0430 09/01/14 0548  HGB 10.2* 10.0*  --   HCT 31.0* 29.9*  --   PLT 146* 154  --   LABPROT 18.1* 18.9* 23.0*  INR 1.49 1.57* 2.02*  CREATININE  --  0.76  --     Estimated Creatinine Clearance: 117 mL/min (by C-G formula based on Cr of 0.76).   Medications:  Scheduled:  . antiseptic oral rinse  7 mL Mouth Rinse BID  . azelastine  1 spray Each Nare Daily   And  . fluticasone  1 spray Each Nare Daily  . docusate sodium  100 mg Oral BID  . ezetimibe-simvastatin  1 tablet Oral Daily  . glimepiride  1 mg Oral Q breakfast  . insulin aspart  0-15 Units Subcutaneous TID WC  . polyethylene glycol  17 g Oral Daily  . pregabalin  50 mg Oral TID  . triamterene-hydrochlorothiazide  1 tablet Oral Daily  . Warfarin - Pharmacist Dosing Inpatient   Does not apply q1800   Infusions:    Assessment: 63 yo male with hx of DVT/PE is currently on therapeutic coumadin, but INR jumped from 1.57 to 2.02 Goal of Therapy:  INR 2-3 Monitor platelets by anticoagulation protocol: Yes   Plan:  - coumadin 4mg  po x1 - INR in am  Martika Egler, Tsz-Yin 09/01/2014,8:16 AM

## 2014-09-01 NOTE — Progress Notes (Signed)
Physical Therapy Note  Patient Details  Name: Joshua Fields MRN: [id number] Date of Birth: 11/17/1950 Today's Date: 09/01/2014    Pt missed 30 mins of skilled PT time this afternoon due to increased pain in LLE. Also declined bed level exercises.  Will attempt to make up at later date.    [name], Lachandra Dettmann Ann 09/01/2014, 2:42 PM

## 2014-09-01 NOTE — Progress Notes (Signed)
Occupational Therapy Session Note  Patient Details  Name: Joshua Fields MRN: [id number] Date of Birth: 11/06/1950  Today's Date: 09/01/2014 OT Individual Time: [id number] and [id number] OT Individual Time Calculation (min): 60 min and 60 min   Short Term Goals: Week 1:  OT Short Term Goal 1 (Week 1): STGs= LTGs secondary to short LOS  Skilled Therapeutic Interventions/Progress Updates:  Session 1:Upon entering the room, pt supine in bed with RN present giving medication. Pt with c/o 9/10 pain in L LE but reports, "It is not as bad" by end of session. Pt performed bathing and dressing seated EOB with steady assist while standing to wash for peri area and buttocks. Pt standing for ~ 5 minutes to complete washing during session. UB ADLs and grooming performed with set up A at sink and in recliner chair at sink side. Pt requiring increased time to complete tasks during session and wanting to wear O2 via [place]. However, stats checked multiple times during session with O2 remaining at or above 90% throughout session. Pt seated in recliner chair with B LEs elevated and call bell within reach upon exiting the room.   Session 2: Pt seated in recliner chair with B LEs elevated. Pt with 9/10 c/o pain in L LE. OT spoke to RN who reported giving prn pain medication prior to therapist arrival. Pt requiring coaxing for participation and movement with increased time secondary to pain. Pt ambulated to toilet with use of RW and steady assist for safety. Toilet transfer onto elevated toilet seat with steady assist. Pt performed clothing management and hygiene with steady assist as well while standing with RW. OT educated pt on pain management and techniques to utilize in order to assist with decreasing pain. Pt verbalized understanding. Pt ambulated back to bed where pants were discovered to be wet. Set up A to change pants with pt performing lateral leans in bed to pull up hips. OT applied ice pack to L LE and elevated leg in  order to decrease pain. Pt O2 stats remained above 93% throughtout session with O2 on room air. OT notified RN and oxygen left off. Call bell within reach and wife present in room upon exiting.   Therapy Documentation Precautions:  Precautions Precautions: Fall Required Braces or Orthoses: Other Brace/Splint Other Brace/Splint: CAM boot when walking Restrictions Weight Bearing Restrictions: Yes LLE Weight Bearing: Weight bearing as tolerated Other Position/Activity Restrictions: with CAM boot (per OP REPORT) Pain: Pain Assessment Pain Score: 9   See FIM for current functional status  Therapy/Group: Individual Therapy  Lowella Gripittman, Dieter Hane L 09/01/2014, 12:16 PM

## 2014-09-02 ENCOUNTER — Inpatient Hospital Stay (HOSPITAL_COMMUNITY): Payer: Medicare Other | Admitting: Physical Therapy

## 2014-09-02 ENCOUNTER — Inpatient Hospital Stay (HOSPITAL_COMMUNITY): Payer: Medicare Other | Admitting: Occupational Therapy

## 2014-09-02 DIAGNOSIS — T1490 Injury, unspecified: Secondary | ICD-10-CM

## 2014-09-02 DIAGNOSIS — R600 Localized edema: Secondary | ICD-10-CM

## 2014-09-02 DIAGNOSIS — W14XXXS Fall from tree, sequela: Secondary | ICD-10-CM

## 2014-09-02 DIAGNOSIS — S82301D Unspecified fracture of lower end of right tibia, subsequent encounter for closed fracture with routine healing: Secondary | ICD-10-CM

## 2014-09-02 LAB — GLUCOSE, CAPILLARY
Glucose-Capillary: 139 mg/dL — ABNORMAL HIGH (ref 70–99)
Glucose-Capillary: 127 mg/dL — ABNORMAL HIGH (ref 70–99)
Glucose-Capillary: 113 mg/dL — ABNORMAL HIGH (ref 70–99)
Glucose-Capillary: 147 mg/dL — ABNORMAL HIGH (ref 70–99)

## 2014-09-02 LAB — PROTIME-INR
INR: 1.88 — AB (ref 0.00–1.49)
Prothrombin Time: 21.8 seconds — ABNORMAL HIGH (ref 11.6–15.2)

## 2014-09-02 MED ORDER — WARFARIN SODIUM 4 MG PO TABS
8.0000 mg | ORAL_TABLET | Freq: Once | ORAL | Status: AC
  Administered 2014-09-02: 8 mg via ORAL
  Filled 2014-09-02: qty 2

## 2014-09-02 MED ORDER — OXYCODONE HCL ER 15 MG PO T12A
15.0000 mg | EXTENDED_RELEASE_TABLET | Freq: Two times a day (BID) | ORAL | Status: DC
  Administered 2014-09-02 – 2014-09-08 (×13): 15 mg via ORAL
  Filled 2014-09-02 (×13): qty 1

## 2014-09-02 MED ORDER — OXYCODONE HCL 5 MG PO TABS
15.0000 mg | ORAL_TABLET | ORAL | Status: DC | PRN
  Administered 2014-09-02 – 2014-09-08 (×21): 15 mg via ORAL
  Filled 2014-09-02 (×22): qty 3

## 2014-09-02 NOTE — Plan of Care (Signed)
Problem: RH BOWEL ELIMINATION Goal: RH STG MANAGE BOWEL WITH ASSISTANCE STG Manage Bowel with modified independence  Outcome: Progressing Goal: RH STG MANAGE BOWEL W/MEDICATION W/ASSISTANCE STG Manage Bowel with Medication with Min Assistance.  Outcome: Progressing  Problem: RH SKIN INTEGRITY Goal: RH STG SKIN FREE OF INFECTION/BREAKDOWN Patient will have no new skin breakdown/infection while on rehab with min assist of caregiver  Outcome: Progressing

## 2014-09-02 NOTE — Progress Notes (Signed)
Joshua SledgeGary E Fields is a 63 y.o. male 01/29/1951 [id number]  Subjective: C/o severe pain in L leg (9/10) - meds don't help. C/o swelling and tightness in L leg, worried about a clot...  Objective: Vital signs in last 24 hours: Temp:  [98.1 F (36.7 C)-98.2 F (36.8 C)] 98.2 F (36.8 C) (11/07 0600) Pulse Rate:  [65-71] 67 (11/07 0600) Resp:  [18-19] 18 (11/07 0600) BP: (104-109)/(45-55) 104/55 mmHg (11/07 0600) SpO2:  [93 %] 93 % (11/07 0600) Weight change:  Last BM Date: 09/01/14  Intake/Output from previous day: 11/06 0701 - 11/07 0700 In: 1080 [P.O.:1080] Out: 975 [Urine:975] Last cbgs: CBG (last 3)   Recent Labs  09/01/14 1705 09/01/14 2114 09/02/14 0729  GLUCAP 86 121* 139*     Physical Exam General: No apparent distress. Obese   HEENT: not dry Lungs: Normal effort. Lungs clear to auscultation, no crackles or wheezes. Cardiovascular: Regular rate and rhythm, no edema Abdomen: S/NT/ND; BS(+) Musculoskeletal:  Unchanged - LLE in ACE wrap Neurological: No new neurological deficits. Toes are warm Wounds: dressed   Skin: clear  Aging changes Mental state: Alert, oriented, cooperative    Lab Results: BMET    Component Value Date/Time   NA 141 08/31/2014 0430   K 3.5* 08/31/2014 0430   CL 97 08/31/2014 0430   CO2 32 08/31/2014 0430   GLUCOSE 132* 08/31/2014 0430   BUN 12 08/31/2014 0430   CREATININE 0.76 08/31/2014 0430   CALCIUM 8.7 08/31/2014 0430   GFRNONAA >90 08/31/2014 0430   GFRAA >90 08/31/2014 0430   CBC    Component Value Date/Time   WBC 5.0 08/31/2014 0430   RBC 3.09* 08/31/2014 0430   HGB 10.0* 08/31/2014 0430   HCT 29.9* 08/31/2014 0430   PLT 154 08/31/2014 0430   MCV 96.8 08/31/2014 0430   MCH 32.4 08/31/2014 0430   MCHC 33.4 08/31/2014 0430   RDW 13.4 08/31/2014 0430   LYMPHSABS 1.0 08/31/2014 0430   MONOABS 0.4 08/31/2014 0430   EOSABS 0.3 08/31/2014 0430   BASOSABS 0.0 08/31/2014 0430    Studies/Results: Dg Abd 1  View  08/31/2014   CLINICAL DATA:  Abdominal pain, nausea, diarrhea  EXAM: ABDOMEN - 1 VIEW  COMPARISON:  None.  FINDINGS: Nonspecific bowel gas pattern with multiple nondilated loops of small bowel in the central abdomen. Colon is non-decompressed with a moderate colonic stool burden.  Degenerative changes of the lumbar spine with lower lumbar spine fixation hardware.  Thoracic spine stimulator.  IMPRESSION: Nonspecific but nonobstructive bowel gas pattern.  Moderate colonic stool burden.   Electronically Signed   By: Charline BillsSriyesh  Krishnan M.D.   On: 08/31/2014 11:13    Medications: I have reviewed the patient's current medications.  Assessment/Plan:  1. Functional deficits secondary to Multitrauma after fall. Closed displaced left tibial shaft fracture with displaced comminuted left distal fibula fracture. Status post IM nailing. Weightbearing as tolerated 2. 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 Monitor for bleeding episodes 3. Pain Management: Lyrica 50 mg 3 times a day, oxycodone as needed. Added Oxycontin.  Monitor with increased mobility 4. Mood: generally appropriate. Team to provide ego support as possible 5. Neuropsych: This patient is capable of making decisions on his own behalf. 6. Skin/Wound Care: routine skin checks 7. Fluids/Electrolytes/Nutrition: Strict INO's. Follow-up chemistries. Provide nutritional supplements as needed 8. Diabetes mellitus with peripheral neuropathy. Amaryl 1 mg daily. Check blood sugar before meals and at bedtime. 9.Hypertension. Maxide 37.5-25 mg Daily. Monitor with increased  mobility 10.Hyperlipidemia. Continue Vytorin 11. Constipation , adjust bowel program, no evidence of ileus on exam or xray 12. LLE tightness. H/o DVT/PE. Will get a Venous Doppler. INR ok    Length of stay, days: 3  Sonda PrimesAlex Nayab Aten , MD 09/02/2014, 8:28 AM

## 2014-09-02 NOTE — Plan of Care (Signed)
Problem: RH BOWEL ELIMINATION Goal: RH STG MANAGE BOWEL W/MEDICATION W/ASSISTANCE STG Manage Bowel with Medication with [place].  Outcome: Progressing  Problem: RH SKIN INTEGRITY Goal: RH STG SKIN FREE OF INFECTION/BREAKDOWN Patient will have no new skin breakdown/infection while on rehab with min assist of caregiver  Outcome: Progressing Goal: RH STG MAINTAIN SKIN INTEGRITY WITH ASSISTANCE STG Maintain Skin Integrity With [place].  Outcome: Progressing Goal: RH STG ABLE TO PERFORM INCISION/WOUND CARE W/ASSISTANCE STG Able To Perform Incision/Wound Care With [employer].  Outcome: Not Applicable Date Met:  19/69/40  Problem: RH SAFETY Goal: RH STG ADHERE TO SAFETY PRECAUTIONS W/ASSISTANCE/DEVICE STG Adhere to Safety Precautions With Min Assist and Assistance/Device.  Outcome: Not Progressing  Problem: RH PAIN MANAGEMENT Goal: RH STG PAIN MANAGED AT OR BELOW PT'S PAIN GOAL 3 or less on scale of 1-10  Outcome: Progressing

## 2014-09-02 NOTE — Progress Notes (Signed)
Occupational Therapy Session Note  Patient Details  Name: Joshua Fields MRN: [id number] Date of Birth: 12/31/1950  Today's Date: 09/02/2014 OT Individual Time: [id number] and 1357-1445 OT Individual Time Calculation (min): 45 and 48 min    Short Term Goals: Week 1:  OT Short Term Goal 1 (Week 1): STGs= LTGs secondary to short LOS  Skilled Therapeutic Interventions/Progress Updates:  Session 1:Upon entering the room, pt supine in bed and reporting, "I am clean. I am not going to do that today." Pt performed 3 sets of 20 chest pull, shoulder flexion, shoulder diagonals, and bicep curls with green theraband in order to increase endurance and B UE strength for transfers/functional mobility. Pt required coaxing for participation and rest breaks secondary to fatigue. OT educated on energy conservation with functional tasks and ambulation. Pt with 8/10 c/o pain at end of session. Pt returned to supine in bed with ice applied per pt request and RN notified. Call bell within reach upon exiting the room.    Session 2: Upon entering the room, pt supine on BSC with 9/10 c/o pain. RN notified but pt not due for medication at this time. Toileting performed with steady assist as pt stood with RW for clothing management and hygiene. Pt ambulating short distance to wheelchair and propelling self to ADL bathroom for education. OT educated pt on use and benefits of TTB for home use after discharge. Pt reports he may possibly own one but will have to check. Therapist spoke about having family check equipment for integrity as well for safety. OT demonstrated transfer onto TTB with use of RW. However, pt refusing to participate secondary to increased pain. Therapist also discussing fall risks within bathroom as well as other areas of home. Pt verbalizing understanding but education to continue. Pt transferred into bed with min A for L LE with ice pack applied and call bell within reach upon exiting the room.   Therapy  Documentation Precautions:  Precautions Precautions: Fall Required Braces or Orthoses: Other Brace/Splint Other Brace/Splint: CAM boot when walking Restrictions Weight Bearing Restrictions: Yes LLE Weight Bearing: Weight bearing as tolerated (with cam boot on) Other Position/Activity Restrictions: with CAM boot (per OP REPORT)  See FIM for current functional status  Therapy/Group: Individual Therapy  Lowella Gripittman, Tea Collums L 09/02/2014, 3:32 PM

## 2014-09-02 NOTE — Progress Notes (Signed)
ANTICOAGULATION CONSULT NOTE - Follow Up Consult  Pharmacy Consult:  Coumadin Indication:  History of PE/DVT  No Known Allergies  Patient Measurements: Height: 5\' 7"  (170.2 cm) Weight: 263 lb 14.3 oz (119.7 kg) IBW/kg (Calculated) : 66.1  Vital Signs: Temp: 98.2 F (36.8 C) (11/07 0600) Temp Source: Oral (11/07 0600) BP: 130/70 mmHg (11/07 0830) Pulse Rate: 71 (11/07 0830)  Labs:  Recent Labs  08/31/14 0430 09/01/14 0548 09/02/14 0400  HGB 10.0*  --   --   HCT 29.9*  --   --   PLT 154  --   --   LABPROT 18.9* 23.0* 21.8*  INR 1.57* 2.02* 1.88*  CREATININE 0.76  --   --     Estimated Creatinine Clearance: 117 mL/min (by C-G formula based on Cr of 0.76).     Assessment: 63 y/o M who is already on warfarin for history of DVT/PE, now continuing warfarin which will also serve as VTE prophylaxis s/p intramedullary nailing of left tibia.  INR decreased to sub-therapeutic level; no bleeding reported.   Goal of Therapy:  INR 2-3    Plan:  - Coumadin 8mg  PO today - Daily PT / INR    Latrisha Coiro D. Laney Potashang, PharmD, BCPS Pager:  [phone] 09/02/2014, 12:47 PM

## 2014-09-02 NOTE — Progress Notes (Signed)
Physical Therapy Session Note  Patient Details  Name: Joshua Fields MRN: [id number] Date of Birth: 02/01/1951  Today's Date: 09/02/2014 PT Individual Time: 0800-0900 Treatment Session 2: 1300-1330 PT Individual Time Calculation (min): 60 min  Treatment Session 2: 30 min  Short Term Goals: Week 1:  PT Short Term Goal 1 (Week 1): =LTGs due to ELOS  Skilled Therapeutic Interventions/Progress Updates:    Therapeutic Activity: PT instructs pt in bed mobility supine to sit without rails req SBA.  PT instructs pt in safe sit to stand from bed, chair, and w/c req SBA for safety. PT instructs pt in w/c to bed transfer req SBA.   PT instructs pt in sit to supine transfer without rails req SBA.   W/C Management: PT instructs pt in w/c propulsion with B UEs on level surface x 150' x 2 reps req SBA.   Gait Training: PT instructs pt in ambulation x 30' + 35' req SBA with RW - slow pace, but good L foot floor contact.  PT instructs pt in ascending/descending 2-3 practice stairs with B handrails req min A x 2 reps.   Treatment Session 2: Therapeutic Exercise:  PT instructs pt in L LE ROM and strengthening exercises: AAROM LAQ x 10 reps, AAROM L SLR x 5 reps.   Therapeutic Activity: PT instructs pt in sit to/from supine with bedrail req SBA, scooting up in bed with B bedrails req SBA.  PT instructs pt in [facility] and increased time due to L leg pain due to pt report that he needs to have a BM.   Pt is very pain dominant, but participates well during both sessions of PT, today. Pt req increased exercise/strengthening to L LE, which will improve his safety and independence with functional mobility. After PM session, [facility] in reach and RN notified. Continue per PT POC.   Therapy Documentation Precautions:  Precautions Precautions: Fall Required Braces or Orthoses: Other Brace/Splint Other Brace/Splint: CAM boot when  walking Restrictions Weight Bearing Restrictions: Yes LLE Weight Bearing: Weight bearing as tolerated (with cam boot on) Other Position/Activity Restrictions: with CAM boot (per OP REPORT) Vital Signs: Therapy Vitals Temp: 98.2 F (36.8 C) Temp Source: Oral Pulse Rate: 71 Resp: 18 BP: 130/70 mmHg Patient Position (if appropriate): Sitting Oxygen Therapy SpO2: 97 % O2 Device: Not Delivered Pain: Pain Assessment Pain Assessment: 0-10 Pain Score: 9  Faces Pain Scale: Hurts a little bit Pain Type: Surgical pain Pain Location: Leg Pain Orientation: Left Pain Descriptors / Indicators: Burning Pain Onset: On-going Patients Stated Pain Goal: 6 Pain Intervention(s): Repositioned;Rest Multiple Pain Sites: Yes 2nd Pain Site Pain Score: 8 Pain Type: Chronic pain Pain Location: Back Pain Orientation: Lower Pain Descriptors / Indicators: Stabbing Pain Onset: On-going Pain Intervention(s): Repositioned;Rest  Treatment Session 2: Pt c/o 8/10 pain in L leg - PT utilizes repositioning to decrease pt's pain.   See FIM for current functional status  Therapy/Group: Individual Therapy  Tamani Durney M 09/02/2014, 8:07 AM

## 2014-09-03 ENCOUNTER — Inpatient Hospital Stay (HOSPITAL_COMMUNITY): Payer: PRIVATE HEALTH INSURANCE | Admitting: Rehabilitation

## 2014-09-03 DIAGNOSIS — S82301S Unspecified fracture of lower end of right tibia, sequela: Secondary | ICD-10-CM

## 2014-09-03 LAB — GLUCOSE, CAPILLARY
GLUCOSE-CAPILLARY: 163 mg/dL — AB (ref 70–99)
Glucose-Capillary: 137 mg/dL — ABNORMAL HIGH (ref 70–99)
Glucose-Capillary: 111 mg/dL — ABNORMAL HIGH (ref 70–99)
Glucose-Capillary: 124 mg/dL — ABNORMAL HIGH (ref 70–99)

## 2014-09-03 LAB — PROTIME-INR
INR: 1.98 — AB (ref 0.00–1.49)
Prothrombin Time: 22.6 seconds — ABNORMAL HIGH (ref 11.6–15.2)

## 2014-09-03 MED ORDER — WARFARIN SODIUM 4 MG PO TABS
8.0000 mg | ORAL_TABLET | Freq: Once | ORAL | Status: AC
  Administered 2014-09-03: 8 mg via ORAL
  Filled 2014-09-03: qty 2

## 2014-09-03 MED ORDER — WARFARIN SODIUM 6 MG PO TABS
6.0000 mg | ORAL_TABLET | Freq: Once | ORAL | Status: DC
  Filled 2014-09-03: qty 1

## 2014-09-03 NOTE — Progress Notes (Addendum)
ANTICOAGULATION CONSULT NOTE - Follow Up Consult  Pharmacy Consult:  Coumadin Indication:  History of PE/DVT  No Known Allergies  Patient Measurements: Height: 5\' 7"  (170.2 cm) Weight: 263 lb 14.3 oz (119.7 kg) IBW/kg (Calculated) : 66.1  Vital Signs: Temp: 99.2 F (37.3 C) (11/08 0549) Temp Source: Oral (11/08 0549) BP: 115/61 mmHg (11/08 0549) Pulse Rate: 80 (11/08 0549)  Labs:  Recent Labs  09/01/14 0548 09/02/14 0400 09/03/14 0615  LABPROT 23.0* 21.8* 22.6*  INR 2.02* 1.88* 1.98*    Estimated Creatinine Clearance: 117 mL/min (by C-G formula based on Cr of 0.76).     Assessment: 63 y/o M who is already on warfarin for history of DVT/PE, now continuing warfarin which will also serve as VTE prophylaxis s/p intramedullary nailing of left tibia.  INR trending back up; no bleeding reported.   Goal of Therapy:  INR 2-3    Plan:  - Repeat Coumadin 8mg  PO today - Daily PT / INR - Consider checking BMET (K+ previously low at 3.5) - F/U U/S result   Joshua Fields, PharmD, BCPS Pager:  [phone] 09/03/2014, 10:51 AM

## 2014-09-03 NOTE — Plan of Care (Signed)
Problem: RH SAFETY Goal: RH STG ADHERE TO SAFETY PRECAUTIONS W/ASSISTANCE/DEVICE STG Adhere to Safety Precautions With Min Assist and Assistance/Device.  Outcome: Progressing     

## 2014-09-03 NOTE — Plan of Care (Signed)
Problem: RH SAFETY Goal: RH STG ADHERE TO SAFETY PRECAUTIONS W/ASSISTANCE/DEVICE STG Adhere to Safety Precautions With Min Assist and Assistance/Device.  Outcome: Progressing

## 2014-09-03 NOTE — Progress Notes (Signed)
Joshua Fields is a 63 y.o. male 12/29/1950 [id number]  Subjective: F/u severe pain in L leg - much better... C/o swelling and tightness in L leg, worried about a clot...US was ordered  Objective: Vital signs in last 24 hours: Temp:  [97.9 F (36.6 C)-99.2 F (37.3 C)] 99.2 F (37.3 C) (11/08 0549) Pulse Rate:  [71-80] 80 (11/08 0549) Resp:  [18] 18 (11/08 0549) BP: (115-130)/(61-70) 115/61 mmHg (11/08 0549) SpO2:  [97 %-100 %] 100 % (11/08 0549) Weight change:  Last BM Date: 09/01/14  Intake/Output from previous day: 11/07 0701 - 11/08 0700 In: 480 [P.O.:480] Out: 600 [Urine:600] Last cbgs: CBG (last 3)   Recent Labs  09/02/14 1650 09/02/14 2054 09/03/14 0655  GLUCAP 113* 147* 163*     Physical Exam General: No apparent distress. Obese   HEENT: not dry Lungs: Normal effort. Lungs clear to auscultation, no crackles or wheezes. Cardiovascular: Regular rate and rhythm, no edema Abdomen: S/NT/ND; BS(+) Musculoskeletal:  Unchanged - LLE in ACE wrap; post-op swelling Neurological: No new neurological deficits. Toes are warm Wounds: dressed   Skin: clear  Aging changes Mental state: Alert, oriented, cooperative    Lab Results: BMET    Component Value Date/Time   NA 141 08/31/2014 0430   K 3.5* 08/31/2014 0430   CL 97 08/31/2014 0430   CO2 32 08/31/2014 0430   GLUCOSE 132* 08/31/2014 0430   BUN 12 08/31/2014 0430   CREATININE 0.76 08/31/2014 0430   CALCIUM 8.7 08/31/2014 0430   GFRNONAA >90 08/31/2014 0430   GFRAA >90 08/31/2014 0430   CBC    Component Value Date/Time   WBC 5.0 08/31/2014 0430   RBC 3.09* 08/31/2014 0430   HGB 10.0* 08/31/2014 0430   HCT 29.9* 08/31/2014 0430   PLT 154 08/31/2014 0430   MCV 96.8 08/31/2014 0430   MCH 32.4 08/31/2014 0430   MCHC 33.4 08/31/2014 0430   RDW 13.4 08/31/2014 0430   LYMPHSABS 1.0 08/31/2014 0430   MONOABS 0.4 08/31/2014 0430   EOSABS 0.3 08/31/2014 0430   BASOSABS 0.0 08/31/2014 0430     Studies/Results: No results found.  Medications: I have reviewed the patient's current medications.  Assessment/Plan:  1. Functional deficits secondary to Multitrauma after fall. Closed displaced left tibial shaft fracture with displaced comminuted left distal fibula fracture. Status post IM nailing. Weightbearing as tolerated 2. 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 Monitor for bleeding episodes 3. Pain Management: Lyrica 50 mg 3 times a day, oxycodone as needed. Added Oxycontin - pain is much better.  Monitor with increased mobility 4. Mood: generally appropriate. Team to provide ego support as possible 5. Neuropsych: This patient is capable of making decisions on his own behalf. 6. Skin/Wound Care: routine skin checks 7. Fluids/Electrolytes/Nutrition: Strict INO's. Follow-up chemistries. Provide nutritional supplements as needed 8. Diabetes mellitus with peripheral neuropathy. Amaryl 1 mg daily. Check blood sugar before meals and at bedtime. 9.Hypertension. Maxide 37.5-25 mg Daily. Monitor with increased mobility 10.Hyperlipidemia. Continue Vytorin 11. Constipation , adjust bowel program, no evidence of ileus on exam or xray 12. LLE tightness. H/o DVT/PE. Ordered Venous Doppler. INR ok    Length of stay, days: 4  Sonda PrimesAlex Clay Menser , MD 09/03/2014, 8:18 AM

## 2014-09-03 NOTE — Plan of Care (Signed)
Problem: RH BOWEL ELIMINATION Goal: RH STG MANAGE BOWEL WITH ASSISTANCE STG Manage Bowel with modified independence  Outcome: Progressing Goal: RH STG MANAGE BOWEL W/MEDICATION W/ASSISTANCE STG Manage Bowel with Medication with Min Assistance.  Outcome: Progressing  Problem: RH SKIN INTEGRITY Goal: RH STG MAINTAIN SKIN INTEGRITY WITH ASSISTANCE STG Maintain Skin Integrity With Min Assistance.  Outcome: Progressing  Problem: RH SAFETY Goal: RH STG ADHERE TO SAFETY PRECAUTIONS W/ASSISTANCE/DEVICE STG Adhere to Safety Precautions With Min Assist and Assistance/Device.  Outcome: Progressing  Problem: RH PAIN MANAGEMENT Goal: RH STG PAIN MANAGED AT OR BELOW PT'S PAIN GOAL 3 or less on scale of 1-10  Patient rate pain at 8.

## 2014-09-03 NOTE — Progress Notes (Signed)
Physical Therapy Session Note  Patient Details  Name: Joshua SledgeGary E Twaddle MRN: [id number] Date of Birth: 09/21/1951  Today's Date: 09/03/2014 PT Individual Time: [id number] PT Individual Time Calculation (min): 50 min   Short Term Goals: Week 1:  PT Short Term Goal 1 (Week 1): =LTGs due to ELOS  Skilled Therapeutic Interventions/Progress Updates:   Pt received lying in bed, agreeable to therapy with mod encouragement due to increased pain.  Pt performed supine to sit, self helping LLE with RLE out of bed at min/guard level for safety of LLE.  Once sitting up, assisted with donning R shoe prior to transfer.  Performed bed>w/c and w/c<>mat with RW at close S level with cues for safe hand placement and sequencing.  Pt self propelled to/from therapy gym x 100' using BUEs at S level for overall strengthening and endurance.  Pt continues to be extremely limited by pain and moves very slowly with all aspects of mobility.  Re-adjusted cam boot for better and more appropriate fit during session.  Transferred to mat as stated above to perform supine therex for BLEs as follows; BLE SLR (with assist for LLE) x 10 reps, and BLE hip abd x 10 reps (assist for LLE).  During RLE activity had pts LLE propped on pillow under calf for passive knee ext stretch, as he always tends to keep knee flexed.  Note pt states they may be doing a scan to see for LLE DVT, will keep checking chart, no hold on therapy.  Pt transferred back to sitting with min A for safety of LLE as stated above.  Transferred back to w/c and was assisted back to room.  All needs in reach.   Therapy Documentation Precautions:  Precautions Precautions: Fall Required Braces or Orthoses: Other Brace/Splint Other Brace/Splint: CAM boot when walking Restrictions Weight Bearing Restrictions: Yes LLE Weight Bearing: Weight bearing as tolerated Other Position/Activity Restrictions: with CAM boot (per OP REPORT)   Pain: Pain Assessment Pain Score: 8  Pain  Type: Surgical pain Pain Location: Leg Pain Orientation: Left Pain Intervention(s): Medication (See eMAR)    See FIM for current functional status  Therapy/Group: Individual Therapy  [name], Jamiee Milholland Ann 09/03/2014, 11:18 AM

## 2014-09-03 NOTE — Plan of Care (Signed)
Problem: RH PAIN MANAGEMENT Goal: RH STG PAIN MANAGED AT OR BELOW PT'S PAIN GOAL 3 or less on scale of 1-10  Outcome: Progressing     

## 2014-09-03 NOTE — Plan of Care (Signed)
Problem: RH SKIN INTEGRITY Goal: RH STG SKIN FREE OF INFECTION/BREAKDOWN Patient will have no new skin breakdown/infection while on rehab with min assist of caregiver  Outcome: Progressing

## 2014-09-04 ENCOUNTER — Inpatient Hospital Stay (HOSPITAL_COMMUNITY): Payer: Medicare Other

## 2014-09-04 ENCOUNTER — Inpatient Hospital Stay (HOSPITAL_COMMUNITY): Payer: Medicare Other | Admitting: Physical Therapy

## 2014-09-04 DIAGNOSIS — M79609 Pain in unspecified limb: Secondary | ICD-10-CM

## 2014-09-04 DIAGNOSIS — M79669 Pain in unspecified lower leg: Secondary | ICD-10-CM | POA: Insufficient documentation

## 2014-09-04 DIAGNOSIS — M79662 Pain in left lower leg: Secondary | ICD-10-CM

## 2014-09-04 DIAGNOSIS — M7989 Other specified soft tissue disorders: Secondary | ICD-10-CM

## 2014-09-04 LAB — URINALYSIS, ROUTINE W REFLEX MICROSCOPIC
GLUCOSE, UA: NEGATIVE mg/dL
Ketones, ur: 15 mg/dL — AB
Nitrite: POSITIVE — AB
PH: 5.5 (ref 5.0–8.0)
PROTEIN: NEGATIVE mg/dL
SPECIFIC GRAVITY, URINE: 1.026 (ref 1.005–1.030)
Urobilinogen, UA: 4 mg/dL — ABNORMAL HIGH (ref 0.0–1.0)

## 2014-09-04 LAB — GLUCOSE, CAPILLARY
GLUCOSE-CAPILLARY: 95 mg/dL (ref 70–99)
GLUCOSE-CAPILLARY: 140 mg/dL — AB (ref 70–99)
GLUCOSE-CAPILLARY: 134 mg/dL — AB (ref 70–99)
Glucose-Capillary: 137 mg/dL — ABNORMAL HIGH (ref 70–99)
Glucose-Capillary: 134 mg/dL — ABNORMAL HIGH (ref 70–99)

## 2014-09-04 LAB — URINE MICROSCOPIC-ADD ON

## 2014-09-04 LAB — PROTIME-INR
INR: 1.75 — ABNORMAL HIGH (ref 0.00–1.49)
PROTHROMBIN TIME: 20.6 s — AB (ref 11.6–15.2)

## 2014-09-04 MED ORDER — WARFARIN SODIUM 10 MG PO TABS
10.0000 mg | ORAL_TABLET | Freq: Once | ORAL | Status: AC
  Administered 2014-09-04: 10 mg via ORAL
  Filled 2014-09-04: qty 1

## 2014-09-04 MED ORDER — CIPROFLOXACIN HCL 250 MG PO TABS
250.0000 mg | ORAL_TABLET | Freq: Two times a day (BID) | ORAL | Status: DC
  Administered 2014-09-04 – 2014-09-07 (×7): 250 mg via ORAL
  Filled 2014-09-04 (×10): qty 1

## 2014-09-04 NOTE — Progress Notes (Signed)
ANTICOAGULATION CONSULT NOTE - Follow Up Consult  Pharmacy Consult:  Coumadin Indication:  History of PE/DVT  No Known Allergies  Patient Measurements: Height: 5\' 7"  (170.2 cm) Weight: 263 lb 14.3 oz (119.7 kg) IBW/kg (Calculated) : 66.1  Vital Signs: Temp: 99.3 F (37.4 C) (11/09 1008) Temp Source: Oral (11/09 1008) BP: 114/60 mmHg (11/09 0629) Pulse Rate: 76 (11/09 0629)  Labs:  Recent Labs  09/02/14 0400 09/03/14 0615 09/04/14 0354  LABPROT 21.8* 22.6* 20.6*  INR 1.88* 1.98* 1.75*    Estimated Creatinine Clearance: 117 mL/min (by C-G formula based on Cr of 0.76).   Assessment: 63 y/o M who is already on warfarin for history of DVT/PE, now continuing warfarin which will also serve as VTE prophylaxis s/p intramedullary nailing of left tibia. INR subtherapeutic 1.75 today; no bleeding reported.   Goal of Therapy:  INR 2-3    Plan:  - Repeat Coumadin 10mg  PO today - Daily PT / INR - Consider checking BMET (K+ previously low at 3.5) - F/U U/S result  Bayard HuggerMei Prerna Harold, PharmD, BCPS  Clinical Pharmacist  Pager: [phone]   09/04/2014, 12:49 PM

## 2014-09-04 NOTE — Progress Notes (Signed)
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 There was no loss of consciousness. INR on admission of 1.89.Cranial CT scan negative.CT abdomen and pelvis with numerous rib fractures and several compression fractures in the spine appear to be chronic and not acute in nature.X-rays and imaging showed a closed displaced left tibial shaft fracture as well as closed displaced comminuted left distal fibular fracture.Underwent open treatment of left tibial shaft fracture with intramedullary nailing closed treatment of left distal fibular fracture 08/28/2014 per Dr. Victorino Dike.Weightbearing as tolerated left lower extremity with cam boot   Subjective/Complaints: Pt was complaining of increased pain over weekend in LLE.  THe INR was sup therapeutic for several days No SOB  Objective: Vital Signs: Blood pressure 114/60, pulse 76, temperature 99.7 F (37.6 C), temperature source Oral, resp. rate 18, height 5\' 7"  (1.702 m), weight 119.7 kg (263 lb 14.3 oz), SpO2 93 %. No results found. Results for orders placed or performed during the hospital encounter of 08/30/14 (from the past 72 hour(s))  Glucose, capillary     Status: Abnormal   Collection Time: 09/01/14 12:06 PM  Result Value Ref Range   Glucose-Capillary 113 (H) 70 - 99 mg/dL   Comment 1 Notify RN   Glucose, capillary     Status: Abnormal   Collection Time: 09/01/14  4:49 PM  Result Value Ref Range   Glucose-Capillary 47 (L) 70 - 99 mg/dL   Comment 1 Notify RN   Glucose, capillary     Status: None   Collection Time: 09/01/14  5:05 PM  Result Value Ref Range   Glucose-Capillary 86 70 - 99 mg/dL   Comment 1 Notify RN   Glucose, capillary     Status: Abnormal   Collection Time: 09/01/14  9:14 PM  Result  Value Ref Range   Glucose-Capillary 121 (H) 70 - 99 mg/dL  Protime-INR     Status: Abnormal   Collection Time: 09/02/14  4:00 AM  Result Value Ref Range   Prothrombin Time 21.8 (H) 11.6 - 15.2 seconds   INR 1.88 (H) 0.00 - 1.49  Glucose, capillary     Status: Abnormal   Collection Time: 09/02/14  7:29 AM  Result Value Ref Range   Glucose-Capillary 139 (H) 70 - 99 mg/dL   Comment 1 Notify RN   Glucose, capillary     Status: Abnormal   Collection Time: 09/02/14 12:34 PM  Result Value Ref Range   Glucose-Capillary 127 (H) 70 - 99 mg/dL   Comment 1 Notify RN   Glucose, capillary     Status: Abnormal   Collection Time: 09/02/14  4:50 PM  Result Value Ref Range   Glucose-Capillary 113 (H) 70 - 99 mg/dL   Comment 1 Notify RN   Glucose, capillary     Status: Abnormal   Collection Time: 09/02/14  8:54 PM  Result Value Ref Range   Glucose-Capillary 147 (H) 70 - 99 mg/dL  Protime-INR     Status: Abnormal   Collection Time: 09/03/14  6:15 AM  Result Value Ref Range   Prothrombin Time 22.6 (H) 11.6 - 15.2 seconds   INR 1.98 (H) 0.00 - 1.49  Glucose, capillary     Status: Abnormal   Collection Time: 09/03/14  6:55 AM  Result Value Ref Range   Glucose-Capillary 163 (H) 70 -  99 mg/dL  Glucose, capillary     Status: Abnormal   Collection Time: 09/03/14 11:40 AM  Result Value Ref Range   Glucose-Capillary 137 (H) 70 - 99 mg/dL  Glucose, capillary     Status: Abnormal   Collection Time: 09/03/14  4:33 PM  Result Value Ref Range   Glucose-Capillary 111 (H) 70 - 99 mg/dL  Glucose, capillary     Status: Abnormal   Collection Time: 09/03/14  9:36 PM  Result Value Ref Range   Glucose-Capillary 124 (H) 70 - 99 mg/dL  Protime-INR     Status: Abnormal   Collection Time: 09/04/14  3:54 AM  Result Value Ref Range   Prothrombin Time 20.6 (H) 11.6 - 15.2 seconds   INR 1.75 (H) 0.00 - 1.49  Glucose, capillary     Status: Abnormal   Collection Time: 09/04/14  6:27 AM  Result Value Ref Range    Glucose-Capillary 137 (H) 70 - 99 mg/dL     HEENT: normal Cardio: RRR and no murmur Resp: CTA B/L and unlabored GI: BS positive and NT,ND, soft Extremity:  Pulses positive and No Edema Skin:   Other ACE on LLE Neuro: Alert/Oriented and Abnormal Motor 5/5 in BUE, 4+/5 RLE, 2- R HF, 3- KE and ankle DF limited by pain Musc/Skel:  Swelling Left toes, pain with AROM LLE, prox and distal incisions healing well, distal incisions with small amt of blood (couple drops),  Pain with movement.  ACE wrap bunched up around knee.   GEN  NAD   Assessment/Plan: 1. Functional deficits secondary to Left tib/fib fractures  which require 3+ hours per day of interdisciplinary therapy in a comprehensive inpatient rehab setting. Physiatrist is providing close team supervision and 24 hour management of active medical problems listed below. Physiatrist and rehab team continue to assess barriers to discharge/monitor patient progress toward functional and medical goals. FIM: FIM - Bathing Bathing Steps Patient Completed: Chest, Right Arm, Left Arm, Abdomen, Front perineal area, Buttocks, Right upper leg, Left upper leg, Right lower leg (including foot) Bathing: 4: Steadying assist  FIM - Upper Body Dressing/Undressing Upper body dressing/undressing steps patient completed: Thread/unthread right sleeve of pullover shirt/dresss, Thread/unthread left sleeve of pullover shirt/dress, Put head through opening of pull over shirt/dress, Pull shirt over trunk Upper body dressing/undressing: 5: Set-up assist to: Obtain clothing/put away FIM - Lower Body Dressing/Undressing Lower body dressing/undressing steps patient completed: Thread/unthread right pants leg, Pull pants up/down, Don/Doff right shoe Lower body dressing/undressing: 2: Max-Patient completed 25-49% of tasks  FIM - Toileting Toileting steps completed by patient: Adjust clothing prior to toileting, Performs perineal hygiene, Adjust clothing after  toileting Toileting Assistive Devices: Grab bar or rail for support Toileting: 4: Steadying assist  FIM - Diplomatic Services operational officerToilet Transfers Toilet Transfers Assistive Devices: PsychiatristBedside commode Toilet Transfers: 4-To toilet/BSC: Min A (steadying Pt. > 75%), 4-From toilet/BSC: Min A (steadying Pt. > 75%)  FIM - Bed/Chair Transfer Bed/Chair Transfer Assistive Devices: Walker, Arm rests, Orthosis (cam boot) Bed/Chair Transfer: 4: Supine > Sit: Min A (steadying Pt. > 75%/lift 1 leg), 5: Bed > Chair or W/C: Supervision (verbal cues/safety issues), 4: Sit > Supine: Min A (steadying pt. > 75%/lift 1 leg), 5: Chair or W/C > Bed: Supervision (verbal cues/safety issues)  FIM - Locomotion: Wheelchair Distance: 100 Locomotion: Wheelchair: 2: Travels 50 - 149 ft with supervision, cueing or coaxing FIM - Locomotion: Ambulation Locomotion: Ambulation Assistive Devices: Designer, industrial/productWalker - Rolling Ambulation/Gait Assistance: 5: Supervision Locomotion: Ambulation: 1: Travels less than 50 ft with  supervision/safety issues  Comprehension Comprehension Mode: Auditory Comprehension: 6-Follows complex conversation/direction: With extra time/assistive device  Expression Expression Mode: Verbal Expression: 6-Expresses complex ideas: With extra time/assistive device  Social Interaction Social Interaction Mode: Asleep Social Interaction: 4-Interacts appropriately 75 - 89% of the time - Needs redirection for appropriate language or to initiate interaction.  Problem Solving Problem Solving: 4-Solves basic 75 - 89% of the time/requires cueing 10 - 24% of the time  Memory Memory Assistive Devices: Memory book Memory: 4-Recognizes or recalls 75 - 89% of the time/requires cueing 10 - 24% of the time  Medical Problem List and Plan: 1. Functional deficits secondary to Multitrauma after fall. Closed displaced left tibial shaft fracture with displaced comminuted left distal fibula fracture. Status post IM nailing. Weightbearing as  tolerated 2.  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 Monitor for bleeding episodes, check vasc US, bedrest until complete 3. Pain Management: Lyrica 50 mg 3 times a day, oxycodone as needed. Monitor with increased mobility 4. Mood: generally appropriate. Team to provide ego support as possible 5. Neuropsych: This patient is  capable of making decisions on his own behalf. 6. Skin/Wound Care: routine skin checks 7. Fluids/Electrolytes/Nutrition: Strict INO's. Follow-up chemistries. Provide nutritional supplements as needed 8. Diabetes mellitus with peripheral neuropathy. Amaryl 1 mg daily. Check blood sugar before meals and at bedtime. 9.Hypertension. Maxide 37.5-25 mg  Daily. Monitor with increased mobility 10.Hyperlipidemia. Continue Vytorin 11. Constipation , adjust bowel program, no evidence of ileus on exam or xray, enc fiber  LOS (Days) 5 A FACE TO FACE EVALUATION WAS PERFORMED  Tashawn Greff E 09/04/2014, 7:01 AM

## 2014-09-04 NOTE — Plan of Care (Signed)
Problem: RH BOWEL ELIMINATION Goal: RH STG MANAGE BOWEL WITH ASSISTANCE STG Manage Bowel with modified independence  Outcome: Progressing Goal: RH STG MANAGE BOWEL W/MEDICATION W/ASSISTANCE STG Manage Bowel with Medication with Min Assistance.  Outcome: Progressing  Problem: RH SKIN INTEGRITY Goal: RH STG SKIN FREE OF INFECTION/BREAKDOWN Patient will have no new skin breakdown/infection while on rehab with min assist of caregiver  Outcome: Progressing Goal: RH STG MAINTAIN SKIN INTEGRITY WITH ASSISTANCE STG Maintain Skin Integrity With Min Assistance.  Outcome: Progressing  Problem: RH SAFETY Goal: RH STG ADHERE TO SAFETY PRECAUTIONS W/ASSISTANCE/DEVICE STG Adhere to Safety Precautions With Min Assist and Assistance/Device.  Outcome: Progressing  Problem: RH PAIN MANAGEMENT Goal: RH STG PAIN MANAGED AT OR BELOW PT'S PAIN GOAL 3 or less on scale of 1-10  Outcome: Progressing     

## 2014-09-04 NOTE — Progress Notes (Signed)
VASCULAR LAB PRELIMINARY  PRELIMINARY  PRELIMINARY  PRELIMINARY  Left lower extremity venous Doppler completed.    Preliminary report:  There is no DVT or SVT noted in the left lower extremity.   Rayvin Abid, RVT 09/04/2014, 11:15 AM

## 2014-09-04 NOTE — Progress Notes (Signed)
Physical Therapy Session Note  Patient Details  Name: Joshua SledgeGary E Fields MRN: [id number] Date of Birth: 11/03/1950  Today's Date: 09/04/2014 PT Individual Time: [id number] PT Individual Time Calculation (min): 58 min   Short Term Goals: Week 1:  PT Short Term Goal 1 (Week 1): =LTGs due to ELOS  Skilled Therapeutic Interventions/Progress Updates:   Pt received in bed with daughter and RN present.  Pt reporting 8/10 pain initially but RN just premedicated pt.  Pt performed bed mobility supine > sit with rail and daughter's min A to bring LLE to EOB.  Donned shoe EOB and performed multiple transfers stand pivot bed > w/c > mat in gym with RW and supervision.  Pt performed w/c mobility in controlled environment with supervision 150' but still required assistance for parts management and set up for transfers.  On mat pt practiced supine <> sit on flat mat, no rail with use of RLE to support LLE onto and off of mat; pt performed sit > supine with supervision but required min A at end of session due to pain.  On mat, in supine, pt performed LLE AROM strengthening exercises 5-10 reps each SAQ, heel slides, quad and glute sets, hip IR.  Ceased exercises secondary to pt reporting burning pain on L tibial tuberosity.  Pt requesting to transition to gait training.  Performed gait x 50' with RW and supervision with verbal and visual cues to maintain gait forwards momentum with full step and stride length bilaterally vs. Step to sequence.  During rest break in w/c pt continuing to report pain in LLE; when PT notified RN of pain RN notifying PT that vascular is supposed to be coming to complete LE dopplers and pt is on bedrest until dopplers completed.  Checked orders, no order in for bed rest but it is noted in MD note.  Returned pt to bed with min A and ice pack placed on L knee.    Dopplers noted to be negative later in am.    Therapy Documentation Precautions:  Precautions Precautions: Fall Required Braces or  Orthoses: Other Brace/Splint Other Brace/Splint: CAM boot when walking Restrictions Weight Bearing Restrictions: Yes LLE Weight Bearing: Weight bearing as tolerated (with CAM boot) Other Position/Activity Restrictions: with CAM boot (per OP REPORT) General: PT Amount of Missed Time (min): 2 Minutes (RN notified PT of bedrest at end of session) PT Missed Treatment Reason: MD hold (Comment) (bedrest until dopplers completed) Vital Signs: Therapy Vitals Temp: 99.3 F (37.4 C) Temp Source: Oral Pain: Pain Assessment Pain Assessment: 0-10 Pain Score: 7  Pain Type: Surgical pain Pain Location: Knee Pain Orientation: Left Pain Descriptors / Indicators: Burning Pain Onset: On-going Pain Intervention(s): Cold applied;RN made aware;Elevated extremity;Rest Locomotion : Ambulation Ambulation/Gait Assistance: 5: Supervision Wheelchair Mobility Distance: 150   See FIM for current functional status  Therapy/Group: Individual Therapy  [name], [name] [name] 09/04/2014, 12:25 PM

## 2014-09-04 NOTE — Progress Notes (Signed)
Occupational Therapy Session Note  Patient Details  Name: Joshua SledgeGary E Fields MRN: [id number] Date of Birth: 03/25/1951  Today's Date: 09/04/2014 OT Individual Time: 1300-1400 OT Individual Time Calculation (min): 60 min    Short Term Goals: Week 1:  OT Short Term Goal 1 (Week 1): STGs= LTGs secondary to short LOS  Skilled Therapeutic Interventions/Progress Updates: ADL-retraining with focus on bed mobility, improved pain management, AE training (leg lift and reacher), functional mobility, and improved attention and awareness.   Pt received supine in bed, HOB elevated but unable to access his lunch.   OT provided setup assist and advised pt to eat sitting at edge of bed with both feet on the floor.   After brief delay, pt slowly progressed to rise from supine to side lying and then to sitting at edge of bed using leg lifter provider to manage LLE.   Pt ate portion of his meal and requested assist with managing CAM boot to allow mobility to bathroom, as pt is unable to don boot independently.   OT advised pt on use of reacher to access straps however pt was unable to coordinate use of reacher with finding and lacing lower straps.   Pt then ambulated slowly to bathroom, partially weight-bearing through LLE with some increased pain.   Pt tolieted standing and ambulated toward sink but fatigued and requested to sit in recliner to rest as session was ending.   Pt required mod assist to lower to recliner and setup to manage feet, lever and secure supplies (call light and phone, etc.).     Therapy Documentation Precautions:  Precautions Precautions: Fall Required Braces or Orthoses: Other Brace/Splint Other Brace/Splint: CAM boot when walking Restrictions Weight Bearing Restrictions: Yes LLE Weight Bearing: Weight bearing as tolerated (with CAM boot) Other Position/Activity Restrictions: with CAM boot (per OP REPORT)   Pain: 5/10, left leg during functional tasks; repositioned, ice packs provided at end  of session.    See FIM for current functional status  Therapy/Group: Individual Therapy  Joshua Fields 09/04/2014, 3:34 PM

## 2014-09-04 NOTE — Progress Notes (Signed)
Physical Therapy Session Note  Patient Details  Name: Joshua Fields MRN: [id number] Date of Birth: 07/18/1951  Today's Date: 09/04/2014 PT Individual Time: 1530-1600 PT Individual Time Calculation (min): 30 min   Short Term Goals: Week 1:  PT Short Term Goal 1 (Week 1): =LTGs due to ELOS  Skilled Therapeutic Interventions/Progress Updates:    Pt received seated in recliner, agreeable to participate in therapy after blood drawn for labs. PT deferred treatment until after labs were drawn due to pt's high temperature and pending chest x-ray. After blood was drawn pt ambulated 25' w/ RW and close supervision. Pt felt very flushed and tired at end of ambulation, RN made aware and pt provided w/ wash cloth and ice water. Propelled w/c 100' w/ supervision for navigating obstacles. In rehab gym SPT w/ RW w/c<>mat w/ supervision, min VC's for sequencing and hand placement. Pt navigated up/down 2 7" stairs and 3 4" stairs w/ MinGuard A, mod VC's for foot placement and sequencing. Noted pt dependent on bearing weight through R elbow during stair negotiation, pt stated that stair rails at home are lower and he'd be able to put weight through hand more easily. Pt transported back to room via w/c. SPT w/c>bed w/ RW and supervision. Moved sit>supine w/ leg lifter and supervision. Pt left supine in bed w/ all needs within reach. RN informed of pt's status.   Therapy Documentation Precautions:  Precautions Precautions: Fall Required Braces or Orthoses: Other Brace/Splint Other Brace/Splint: CAM boot when walking Restrictions Weight Bearing Restrictions: Yes LLE Weight Bearing: Weight bearing as tolerated (with CAM boot) Other Position/Activity Restrictions: with CAM boot (per OP REPORT) General:   Vital Signs: Therapy Vitals Temp: (!) 101.2 F (38.4 C) (RN notified) Temp Source: Oral Pulse Rate: (!) 103 Resp: 20 BP: 117/62 mmHg Patient Position (if appropriate): Sitting Oxygen Therapy SpO2: 95  % O2 Device: Not Delivered Pain: Pain Assessment Pain Assessment: 0-10 Pain Score: 8  Pain Type: Acute pain Pain Location: Leg Pain Orientation: Left Pain Intervention(s): Elevated extremity;Cold applied;Repositioned Mobility:   Locomotion :    Trunk/Postural Assessment :    Balance:   Exercises:   Other Treatments:    See FIM for current functional status  Therapy/Group: Individual Therapy  Joshua Fields, Joshua Fields  Joshua Fields, PT, DPT 09/04/2014, 7:42 AM

## 2014-09-05 ENCOUNTER — Encounter (HOSPITAL_COMMUNITY): Payer: PRIVATE HEALTH INSURANCE

## 2014-09-05 ENCOUNTER — Inpatient Hospital Stay (HOSPITAL_COMMUNITY): Payer: PRIVATE HEALTH INSURANCE | Admitting: Rehabilitation

## 2014-09-05 ENCOUNTER — Inpatient Hospital Stay (HOSPITAL_COMMUNITY): Payer: Medicare Other | Admitting: *Deleted

## 2014-09-05 ENCOUNTER — Inpatient Hospital Stay (HOSPITAL_COMMUNITY): Payer: PRIVATE HEALTH INSURANCE | Admitting: Occupational Therapy

## 2014-09-05 LAB — GLUCOSE, CAPILLARY
GLUCOSE-CAPILLARY: 97 mg/dL (ref 70–99)
Glucose-Capillary: 101 mg/dL — ABNORMAL HIGH (ref 70–99)
Glucose-Capillary: 122 mg/dL — ABNORMAL HIGH (ref 70–99)
Glucose-Capillary: 140 mg/dL — ABNORMAL HIGH (ref 70–99)

## 2014-09-05 LAB — PROTIME-INR
INR: 1.9 — ABNORMAL HIGH (ref 0.00–1.49)
PROTHROMBIN TIME: 22 s — AB (ref 11.6–15.2)

## 2014-09-05 MED ORDER — WARFARIN SODIUM 4 MG PO TABS
8.0000 mg | ORAL_TABLET | Freq: Once | ORAL | Status: AC
  Administered 2014-09-05: 8 mg via ORAL
  Filled 2014-09-05: qty 2

## 2014-09-05 NOTE — Plan of Care (Signed)
Problem: RH SKIN INTEGRITY Goal: RH STG SKIN FREE OF INFECTION/BREAKDOWN Patient will have no new skin breakdown/infection while on rehab with min assist of caregiver  Outcome: Progressing     

## 2014-09-05 NOTE — Progress Notes (Signed)
Occupational Therapy Session Note  Patient Details  Name: Joshua SledgeGary E Ekholm MRN: [id number] Date of Birth: 02/12/1951  Today's Date: 09/05/2014 OT Individual Time: 1100-1200 OT Individual Time Calculation (min): 60 min    Short Term Goals: Week 1:  OT Short Term Goal 1 (Week 1): STGs= LTGs secondary to short LOS  Skilled Therapeutic Interventions/Progress Updates:    Pt resting in recliner upon arrival and ready for bathing and dressing.  Pt completed bathing and dressing tasks with sit<>stand from recliner.  Pt utilized reacher and leg lifter to assist with LB bathing and dressing tasks.  Pt completed sit<>stand X 3 with supervision and maintained static and dynamic standing balance with supervision.  Pt completed all tasks at supervision level.  Discussed discharge plans and home set up.  Focus on activity tolerance, sit<>stand, standing balance, and safety awareness.  Therapy Documentation Precautions:  Precautions Precautions: Fall Required Braces or Orthoses: Other Brace/Splint Other Brace/Splint: CAM boot when walking Restrictions Weight Bearing Restrictions: Yes LLE Weight Bearing: Weight bearing as tolerated (with CAM boot) Other Position/Activity Restrictions: with CAM boot (per OP REPORT) Pain: Pain Assessment Pain Assessment: 0-10 Pain Score: 8  Pain Type: Acute pain Pain Location: Leg Pain Orientation: Left Pain Descriptors / Indicators: Throbbing Pain Onset: On-going Pain Intervention(s): RN made aware;Repositioned;Cold applied  See FIM for current functional status  Therapy/Group: Individual Therapy  Rich BraveLanier, Devontre Siedschlag Chappell 09/05/2014, 12:08 PM

## 2014-09-05 NOTE — Plan of Care (Signed)
Problem: RH SAFETY Goal: RH STG ADHERE TO SAFETY PRECAUTIONS W/ASSISTANCE/DEVICE STG Adhere to Safety Precautions With Min Assist and Assistance/Device.  Outcome: Progressing     

## 2014-09-05 NOTE — Progress Notes (Signed)
ANTICOAGULATION CONSULT NOTE - Follow Up Consult  Pharmacy Consult:  Coumadin Indication:  History of PE/DVT  No Known Allergies  Patient Measurements: Height: 5\' 7"  (170.2 cm) Weight: 263 lb 14.3 oz (119.7 kg) IBW/kg (Calculated) : 66.1  Vital Signs: Temp: 98.1 F (36.7 C) (11/10 0536) Temp Source: Oral (11/10 0536) BP: 101/59 mmHg (11/10 0536) Pulse Rate: 69 (11/10 0536)  Labs:  Recent Labs  09/03/14 0615 09/04/14 0354 09/05/14 0457  LABPROT 22.6* 20.6* 22.0*  INR 1.98* 1.75* 1.90*    Estimated Creatinine Clearance: 117 mL/min (by C-G formula based on Cr of 0.76).   Assessment: [age] y/o M who is already on warfarin for history of DVT/PE, now continuing warfarin which will also serve as VTE prophylaxis s/p intramedullary nailing of left tibia. INR subtherapeutic 1.9 today, trending up. No new cbc, no bleeding reported. Started a 10 day course of cipro for UTI yesterday.  Goal of Therapy:  INR 2-3    Plan:  - Repeat Coumadin 8mg  PO today - Daily PT / INR   Bayard HuggerMei Bret Stamour, PharmD, BCPS  Clinical Pharmacist  Pager: [phone]  09/05/2014, 1:29 PM

## 2014-09-05 NOTE — Plan of Care (Signed)
Problem: RH BOWEL ELIMINATION Goal: RH STG MANAGE BOWEL WITH ASSISTANCE STG Manage Bowel with modified independence  Outcome: Progressing     

## 2014-09-05 NOTE — Plan of Care (Signed)
Problem: RH PAIN MANAGEMENT Goal: RH STG PAIN MANAGED AT OR BELOW PT'S PAIN GOAL 3 or less on scale of 1-10  Outcome: Not Progressing

## 2014-09-05 NOTE — Progress Notes (Signed)
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 There was no loss of consciousness. INR on admission of 1.89.Cranial CT scan negative.CT abdomen and pelvis with numerous rib fractures and several compression fractures in the spine appear to be chronic and not acute in nature.X-rays and imaging showed a closed displaced left tibial shaft fracture as well as closed displaced comminuted left distal fibular fracture.Underwent open treatment of left tibial shaft fracture with intramedullary nailing closed treatment of left distal fibular fracture 08/28/2014 per Dr. Victorino DikeHewitt.Weightbearing as tolerated left lower extremity with cam boot   Subjective/Complaints: Fever yesterday but feels better  today No SOB  Objective: Vital Signs: Blood pressure 101/59, pulse 69, temperature 98.1 F (36.7 C), temperature source Oral, resp. rate 19, height 5\' 7"  (1.702 m), weight 119.7 kg (263 lb 14.3 oz), SpO2 92 %. Dg Chest 2 View  09/04/2014   CLINICAL DATA:  Fever for 1 day.  EXAM: CHEST  2 VIEW  COMPARISON:  08/27/2014.  FINDINGS: Cardiomegaly is unchanged. Lung volumes are mildly reduced. Bulla is present over the cardiopericardial silhouette on the lateral view. Spinal stimulator lead terminates over lower cervical ACDF plate. Thoracolumbar compression fracture is present which appears chronic.  No airspace disease or effusion identified.  Likely chronic LEFT-sided rib fractures again noted.  IMPRESSION: Cardiomegaly without failure.   Electronically Signed   By: Andreas NewportGeoffrey  Lamke M.D.   On: 09/04/2014 18:28   Results for orders placed or performed during the hospital encounter of 08/30/14 (from the past 72 hour(s))  Glucose, capillary     Status: Abnormal   Collection Time:  09/02/14 12:34 PM  Result Value Ref Range   Glucose-Capillary 127 (H) 70 - 99 mg/dL   Comment 1 Notify RN   Glucose, capillary     Status: Abnormal   Collection Time: 09/02/14  4:50 PM  Result Value Ref Range   Glucose-Capillary 113 (H) 70 - 99 mg/dL   Comment 1 Notify RN   Glucose, capillary     Status: Abnormal   Collection Time: 09/02/14  8:54 PM  Result Value Ref Range   Glucose-Capillary 147 (H) 70 - 99 mg/dL  Protime-INR     Status: Abnormal   Collection Time: 09/03/14  6:15 AM  Result Value Ref Range   Prothrombin Time 22.6 (H) 11.6 - 15.2 seconds   INR 1.98 (H) 0.00 - 1.49  Glucose, capillary     Status: Abnormal   Collection Time: 09/03/14  6:55 AM  Result Value Ref Range   Glucose-Capillary 163 (H) 70 - 99 mg/dL  Glucose, capillary     Status: Abnormal   Collection Time: 09/03/14 11:40 AM  Result Value Ref Range   Glucose-Capillary 137 (H) 70 - 99 mg/dL  Glucose, capillary     Status: Abnormal   Collection Time: 09/03/14  4:33 PM  Result Value Ref Range   Glucose-Capillary 111 (H) 70 - 99 mg/dL  Glucose, capillary     Status: Abnormal   Collection Time: 09/03/14  9:36 PM  Result Value Ref Range   Glucose-Capillary 124 (H) 70 - 99 mg/dL  Protime-INR     Status: Abnormal   Collection Time: 09/04/14  3:54 AM  Result Value Ref Range   Prothrombin Time 20.6 (H) 11.6 - 15.2 seconds   INR 1.75 (H) 0.00 - 1.49  Glucose, capillary     Status: Abnormal   Collection Time: 09/04/14  6:27 AM  Result Value Ref Range   Glucose-Capillary 137 (H) 70 - 99 mg/dL  Glucose, capillary     Status: None   Collection Time: 09/04/14 11:50 AM  Result Value Ref Range   Glucose-Capillary 95 70 - 99 mg/dL   Comment 1 Notify RN   Glucose, capillary     Status: Abnormal   Collection Time: 09/04/14  2:26 PM  Result Value Ref Range   Glucose-Capillary 140 (H) 70 - 99 mg/dL  Urinalysis, Routine w reflex microscopic     Status: Abnormal   Collection Time: 09/04/14  2:52 PM  Result Value  Ref Range   Color, Urine ORANGE (A) YELLOW    Comment: BIOCHEMICALS MAY BE AFFECTED BY COLOR   APPearance CLOUDY (A) CLEAR   Specific Gravity, Urine 1.026 1.005 - 1.030   pH 5.5 5.0 - 8.0   Glucose, UA NEGATIVE NEGATIVE mg/dL   Hgb urine dipstick MODERATE (A) NEGATIVE   Bilirubin Urine SMALL (A) NEGATIVE   Ketones, ur 15 (A) NEGATIVE mg/dL   Protein, ur NEGATIVE NEGATIVE mg/dL   Urobilinogen, UA 4.0 (H) 0.0 - 1.0 mg/dL   Nitrite POSITIVE (A) NEGATIVE   Leukocytes, UA SMALL (A) NEGATIVE  Urine microscopic-add on     Status: Abnormal   Collection Time: 09/04/14  2:52 PM  Result Value Ref Range   Squamous Epithelial / LPF RARE RARE   WBC, UA 11-20 <3 WBC/hpf   RBC / HPF 0-2 <3 RBC/hpf   Bacteria, UA MANY (A) RARE  Glucose, capillary     Status: Abnormal   Collection Time: 09/04/14  4:52 PM  Result Value Ref Range   Glucose-Capillary 134 (H) 70 - 99 mg/dL   Comment 1 Notify RN   Glucose, capillary     Status: Abnormal   Collection Time: 09/04/14  9:04 PM  Result Value Ref Range   Glucose-Capillary 134 (H) 70 - 99 mg/dL  Protime-INR     Status: Abnormal   Collection Time: 09/05/14  4:57 AM  Result Value Ref Range   Prothrombin Time 22.0 (H) 11.6 - 15.2 seconds   INR 1.90 (H) 0.00 - 1.49  Glucose, capillary     Status: Abnormal   Collection Time: 09/05/14  6:54 AM  Result Value Ref Range   Glucose-Capillary 101 (H) 70 - 99 mg/dL     HEENT: normal Cardio: RRR and no murmur Resp: CTA B/L and unlabored GI: BS positive and NT,ND, soft Extremity:  Pulses positive and No Edema Skin:   Other ACE on LLE Neuro: Alert/Oriented and Abnormal Motor 5/5 in BUE, 4+/5 RLE, 2- R HF, 3- KE and ankle DF limited by pain Musc/Skel:  Swelling Left toes, pain with AROM LLE, prox and distal incisions healing well, distal incisions with small amt of blood (couple drops),  Pain with movement.  ACE wrap bunched up around knee.   GEN  NAD   Assessment/Plan: 1. Functional deficits secondary to Left  tib/fib fractures  which require 3+ hours per day of interdisciplinary therapy in a comprehensive inpatient rehab setting. Physiatrist is providing close team supervision and 24 hour management of active medical problems listed below. Physiatrist and rehab team continue to assess barriers to discharge/monitor patient progress toward functional and medical goals. FIM: FIM - Bathing Bathing Steps Patient Completed: Chest, Right Arm, Left Arm, Abdomen, Front perineal area, Buttocks, Right upper leg, Left upper leg, Right lower leg (including  foot) Bathing: 4: Steadying assist  FIM - Upper Body Dressing/Undressing Upper body dressing/undressing steps patient completed: Thread/unthread right sleeve of pullover shirt/dresss, Thread/unthread left sleeve of pullover shirt/dress, Put head through opening of pull over shirt/dress, Pull shirt over trunk Upper body dressing/undressing: 5: Set-up assist to: Obtain clothing/put away FIM - Lower Body Dressing/Undressing Lower body dressing/undressing steps patient completed: Thread/unthread right pants leg, Pull pants up/down, Don/Doff right shoe Lower body dressing/undressing: 2: Max-Patient completed 25-49% of tasks  FIM - Toileting Toileting steps completed by patient: Adjust clothing prior to toileting, Performs perineal hygiene, Adjust clothing after toileting Toileting Assistive Devices: Grab bar or rail for support Toileting: 4: Steadying assist  FIM - Diplomatic Services operational officer Devices: Psychiatrist Transfers: 4-To toilet/BSC: Min A (steadying Pt. > 75%), 4-From toilet/BSC: Min A (steadying Pt. > 75%)  FIM - Bed/Chair Transfer Bed/Chair Transfer Assistive Devices: Walker, Arm rests, Orthosis Bed/Chair Transfer: 5: Sit > Supine: Supervision (verbal cues/safety issues), 4: Bed > Chair or W/C: Min A (steadying Pt. > 75%), 4: Chair or W/C > Bed: Min A (steadying Pt. > 75%)  FIM - Locomotion: Wheelchair Distance:  150 Locomotion: Wheelchair: 2: Travels 50 - 149 ft with supervision, cueing or coaxing FIM - Locomotion: Ambulation Locomotion: Ambulation Assistive Devices: Designer, industrial/product Ambulation/Gait Assistance: 5: Supervision Locomotion: Ambulation: 1: Travels less than 50 ft with supervision/safety issues  Comprehension Comprehension Mode: Auditory Comprehension: 6-Follows complex conversation/direction: With extra time/assistive device  Expression Expression Mode: Verbal Expression: 6-Expresses complex ideas: With extra time/assistive device  Social Interaction Social Interaction Mode: Asleep Social Interaction: 4-Interacts appropriately 75 - 89% of the time - Needs redirection for appropriate language or to initiate interaction.  Problem Solving Problem Solving: 4-Solves basic 75 - 89% of the time/requires cueing 10 - 24% of the time  Memory Memory Assistive Devices: Memory book Memory: 4-Recognizes or recalls 75 - 89% of the time/requires cueing 10 - 24% of the time  Medical Problem List and Plan: 1. Functional deficits secondary to Multitrauma after fall. Closed displaced left tibial shaft fracture with displaced comminuted left distal fibula fracture. Status post IM nailing. Weightbearing as tolerated 2.  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 Monitor for bleeding episodes, check vasc US, swelling and pain are better 3. Pain Management: Lyrica 50 mg 3 times a day, oxycodone as needed. Monitor with increased mobility 4. Mood: generally appropriate. Team to provide ego support as possible 5. Neuropsych: This patient is  capable of making decisions on his own behalf. 6. Skin/Wound Care: routine skin checks 7. Fluids/Electrolytes/Nutrition: Strict INO's. Follow-up chemistries. Provide nutritional supplements as needed 8. Diabetes mellitus with peripheral neuropathy. Amaryl 1 mg daily. Check blood sugar before meals and at  bedtime. 9.Hypertension. Maxide 37.5-25 mg  Daily. Monitor with increased mobility 10.Hyperlipidemia. Continue Vytorin 11. Constipation , adjust bowel program, no evidence of ileus on exam or xray, enc fiber 12.  UTI on Cipro LOS (Days) 6 A FACE TO FACE EVALUATION WAS PERFORMED  KIRSTEINS,ANDREW E 09/05/2014, 7:32 AM

## 2014-09-05 NOTE — Progress Notes (Signed)
Physical Therapy Session Note  Patient Details  Name: Joshua Fields MRN: [id number] Date of Birth: 03/21/1951  Today's Date: 09/05/2014 PT Individual Time: [id number] PT Individual Time Calculation (min): 60 min   Short Term Goals: Week 1:  PT Short Term Goal 1 (Week 1): =LTGs due to ELOS  Skilled Therapeutic Interventions/Progress Updates:   Pt received sitting in recliner in room, agreeable to therapy session.  Note that Cam boot not aligned, therefore assisted LLE to ground and re-adjusted boot.  Discussed with pt how to adjust and will also educate wife on how to don and doff.  Performed gait in hallway from recliner in room x 45' with RW at distant S level.  Note pt better able to place LLE flat on ground with stance phase of gait.  Also able to take larger steps on RLE.  Pt then sat in w/c and propelled remainder of distance to therapy gym at S level with BUEs.  Pt negotiated up/down 4, 6" steps with B handrails to simulate home entry.  Performed at min/guard to min A level with max verbal cues for sequencing and also to weight shift over to LLE when stepping RLE.  Pt unable to fully WB on LLE due to increased pain.  Discussed equipment needs for home and pt currently has RW and w/c with elevating leg rests that he can use at home.  Will notify CSW during conference.  Also discussed possible D/C towards end of week.  Pt agreeable to this plan.  Performed seated LAQs x 10 reps LLE with very light assist for safety (educated to perform in pain free/less pain range).  Tolerated well and also educated that could use leg lifter to perform SLR and hip abd while in bed.  Pt returned demonstration sitting in recliner.  Ambulated another 2685' back towards room with RW as mentioned above.  Left in recliner with all needs in reach.     Therapy Documentation Precautions:  Precautions Precautions: Fall Required Braces or Orthoses: Other Brace/Splint Other Brace/Splint: CAM boot when  walking Restrictions Weight Bearing Restrictions: Yes LLE Weight Bearing: Weight bearing as tolerated (with CAM boot) Other Position/Activity Restrictions: with CAM boot (per OP REPORT)  Pain: Pain Assessment Pain Assessment: 0-10 Pain Score: 7  Pain Type: Acute pain Pain Location: Back Pain Orientation: Mid Pain Descriptors / Indicators: Aching Pain Onset: Gradual Pain Intervention(s): Medication (See eMAR)   Locomotion : Ambulation Ambulation/Gait Assistance: 5: Supervision   See FIM for current functional status  Therapy/Group: Individual Therapy  [name], Eusebia Grulke Ann 09/05/2014, 10:06 AM

## 2014-09-05 NOTE — Progress Notes (Signed)
Physical Therapy Session Note  Patient Details  Name: Joshua SledgeGary E Georg MRN: [id number] Date of Birth: 10/11/1951  Today's Date: 09/05/2014 PT Individual Time: 0802 (Make up tx)-0837 PT Individual Time Calculation (min): 35 min   Short Term Goals: Week 1:  PT Short Term Goal 1 (Week 1): =LTGs due to ELOS  Skilled Therapeutic Interventions/Progress Updates:  Tx focused on functional mobility training and gait training with RW in controlled and room settings. Pt resting in bed upon arrival, agreeable to PT.   Pt performed bed mobility with S, flat HOB and no rails with increased time required. Pt using RLE to carry LLE OOB and carefully to floor. Pt needing A for donning boot this morning for sake of time. Pt able to direct self-cares.  All transfers sit<>stand, stand step with RW, and toilet transfers [facility]S level with increased time and safety cues for optimal RW positioning. RW raised one notch for improved posture and leverage over LLE during mobility.   Pt navigated in/around room and bathroom, including tight spaces, turns, and backing up with RW and close S. Pt had no LOB and was able to stand at toilet x662min with steadying>>S for safety.   Gait training in hall and room 2x12' and 1x38' with RW and close S with cues for step length and posture. Pt limited by pain and fatigue, sweating and tired by end of each trial. Pt propelled WC back to room and was left up in recliner with all needs in reach, LE elevated.      Therapy Documentation Precautions:  Precautions Precautions: Fall Required Braces or Orthoses: Other Brace/Splint Other Brace/Splint: CAM boot when walking Restrictions Weight Bearing Restrictions: Yes LLE Weight Bearing: Weight bearing as tolerated (with CAM boot) Other Position/Activity Restrictions: with CAM boot (per OP REPORT) General:   Vital Signs: Therapy Vitals Temp: 98.1 F (36.7 C) Temp Source: Oral Pulse Rate: 69 Resp: 19 BP: (!) 101/59  mmHg Patient Position (if appropriate): Lying Oxygen Therapy SpO2: 92 % O2 Device: Not Delivered Pain: Pain Assessment Pain Assessment: 0-10 Pain Score: 5  Pain Type: Acute pain Pain Location: Back Pain Orientation: Mid Pain Descriptors / Indicators: Aching Pain Onset: Gradual Pain Intervention(s): Modified tx and provided rest breaks.     Locomotion : Ambulation Ambulation/Gait Assistance: 5: Supervision   See FIM for current functional status  Therapy/Group: Individual Therapy  Clydene Lamingole Vilma Will, PT, DPT   09/05/2014, 8:41 AM

## 2014-09-05 NOTE — Progress Notes (Signed)
Physical Therapy Session Note  Patient Details  Name: Joshua SledgeGary E Pritz MRN: [id number] Date of Birth: 04/03/1951  Today's Date: 09/05/2014 PT Individual Time: 1400-1500 PT Individual Time Calculation (min): 60 min   Short Term Goals: Week 1:  PT Short Term Goal 1 (Week 1): =LTGs due to ELOS  Skilled Therapeutic Interventions/Progress Updates:   Pt received sitting in recliner, agreeable to therapy session, however stating he did not each lunch.  Checked with nurse and tech regarding this matter, and both stated that he ate BBQ sandwich.  Skilled session focused on gait training for quality, distance, endurance, bed mobility with use of leg lifter and supine therex for LLE strengthening.  Pt ambulated x 35' x 1 and another 7440' x 1 with RW at distant S level in controlled and carpeted environment (pt states he has both at home).  Continues to demonstrate very slow cadence, with cues for upright posture, increased smoothness of R step, and keeping LLE flat>heel strike for more normal gait.  Introduced how to use leg lifter to get into/out of bed.  Pt returned demonstration at S level with mod cues for technique and safety of LLE.  Also had pt continue to work on using leg lifter to become more independent at donning/doffing leg rests prior to mobility.  Min cues required, but he is able to perform at S level.  While in supine, performed SLR x 10 reps LLE (with assist and leg lifter), LLE hip abd x 10 reps (with assist and leg lifter) and heel slides LLE x 10 reps (without brace and with leg lifter).  Pt with increased pain during knee flex.  Discussed having wife come in for training on Thursday, CSW also notified and both to contact her to come in from [place].  Pt returned back to sitting and ambulated back towards room distance stated above.  Self propelled remainder of distance and transferred back to bed using RW at S level.  Again, utilized leg lifter at S level.  Assisted with doffing brace.  Left in  bed with all needs in reach.  RN in room.   Therapy Documentation Precautions:  Precautions Precautions: Fall Required Braces or Orthoses: Other Brace/Splint Other Brace/Splint: CAM boot when walking Restrictions Weight Bearing Restrictions: Yes LLE Weight Bearing: Weight bearing as tolerated (with CAM boot) Other Position/Activity Restrictions: with CAM boot (per OP REPORT)   Vital Signs: Therapy Vitals Temp: 98.7 F (37.1 C) Temp Source: Oral Pulse Rate: 80 Resp: 18 BP: (!) 103/59 mmHg Patient Position (if appropriate): Sitting Oxygen Therapy SpO2: 98 % O2 Device: Not Delivered Pain: Pain Assessment Pain Score: 7   See FIM for current functional status  Therapy/Group: Individual Therapy  [name], [place] Witz Ann 09/05/2014, 4:17 PM

## 2014-09-05 NOTE — Progress Notes (Signed)
Social Work Patient ID: Joshua SledgeGary E Terrio, male   DOB: 03/16/1951, 63 y.o.   MRN: [id number] Spoke with Emily-PT to discuss the need for family education for Thurs and discharge Friday. Pt feels better today but is still in pain.  He states; " no pain no gain."  Will work toward family education and discharge.

## 2014-09-06 ENCOUNTER — Inpatient Hospital Stay (HOSPITAL_COMMUNITY): Payer: PRIVATE HEALTH INSURANCE | Admitting: Rehabilitation

## 2014-09-06 ENCOUNTER — Inpatient Hospital Stay (HOSPITAL_COMMUNITY): Payer: Medicare Other | Admitting: Occupational Therapy

## 2014-09-06 ENCOUNTER — Encounter (HOSPITAL_COMMUNITY): Payer: Self-pay | Admitting: *Deleted

## 2014-09-06 LAB — PROTIME-INR
INR: 1.84 — ABNORMAL HIGH (ref 0.00–1.49)
Prothrombin Time: 21.5 seconds — ABNORMAL HIGH (ref 11.6–15.2)

## 2014-09-06 LAB — GLUCOSE, CAPILLARY
GLUCOSE-CAPILLARY: 112 mg/dL — AB (ref 70–99)
Glucose-Capillary: 98 mg/dL (ref 70–99)
Glucose-Capillary: 118 mg/dL — ABNORMAL HIGH (ref 70–99)
Glucose-Capillary: 112 mg/dL — ABNORMAL HIGH (ref 70–99)

## 2014-09-06 MED ORDER — WARFARIN SODIUM 4 MG PO TABS
8.0000 mg | ORAL_TABLET | Freq: Once | ORAL | Status: AC
  Administered 2014-09-06: 8 mg via ORAL
  Filled 2014-09-06: qty 2

## 2014-09-06 NOTE — Plan of Care (Signed)
Problem: RH BOWEL ELIMINATION Goal: RH STG MANAGE BOWEL WITH ASSISTANCE STG Manage Bowel with modified independence  Outcome: Progressing Goal: RH STG MANAGE BOWEL W/MEDICATION W/ASSISTANCE STG Manage Bowel with Medication with Min Assistance.  Outcome: Progressing  Problem: RH SKIN INTEGRITY Goal: RH STG SKIN FREE OF INFECTION/BREAKDOWN Patient will have no new skin breakdown/infection while on rehab with min assist of caregiver  Outcome: Progressing Goal: RH STG MAINTAIN SKIN INTEGRITY WITH ASSISTANCE STG Maintain Skin Integrity With Min Assistance.  Outcome: Progressing  Problem: RH SAFETY Goal: RH STG ADHERE TO SAFETY PRECAUTIONS W/ASSISTANCE/DEVICE STG Adhere to Safety Precautions With Min Assist and Assistance/Device.  Outcome: Progressing  Problem: RH PAIN MANAGEMENT Goal: RH STG PAIN MANAGED AT OR BELOW PT'S PAIN GOAL 3 or less on scale of 1-10  Outcome: Progressing

## 2014-09-06 NOTE — Progress Notes (Signed)
Occupational Therapy Session Note  Patient Details  Name: Joshua Fields MRN: [id number] Date of Birth: 10/27/1950  Today's Date: 09/06/2014 OT Individual Time: [id number] OT Individual Time Calculation (min): 90 min    Short Term Goals: Week 1:  OT Short Term Goal 1 (Week 1): STGs= LTGs secondary to short LOS  Skilled Therapeutic Interventions/Progress Updates:  Upon entering the room, pt seated in recliner chair by sink with c/o 7/10 pain in L LE. Pt also reporting, "I am just so tired today. I just feel bad." Pt agreeing to participate in ADL session at sink side and refusing shower this session. Pt requiring increased time for activities and maximal encouragement for participation. Pt requesting to take multiple rest breaks during session secondary to fatigue. OT educated pt on use of long handled reacher for LB dressing with pt requiring verbal cues for proper technique. Pt utilizing leg lifter for mobility of L LE secondary to decreased strength. Pt refusing to remain seated in recliner chair and requesting to return to bed at end of session. Pt utilizing leg lifter for bed mobility with supervision for safety. All ADLs performed this session with supervision - Mod I. Pt supine in bed with ice applied to L LE upon exiting the room.   Therapy Documentation Precautions:  Precautions Precautions: Fall Required Braces or Orthoses: Other Brace/Splint Other Brace/Splint: CAM boot when walking Restrictions Weight Bearing Restrictions: Yes LLE Weight Bearing: Weight bearing as tolerated Other Position/Activity Restrictions: with CAM boot (per OP REPORT) Pain: Pain Assessment Pain Assessment: 0-10 (Reassessment) Pain Score: 7 Pain Type: Acute pain Pain Location: Leg Pain Orientation: Left Pain Descriptors / Indicators: Aching Pain Intervention(s): Medication (See eMAR)   See FIM for current functional status  Therapy/Group: Individual Therapy  Lowella Gripittman, [name] 09/06/2014, 11:44  AM

## 2014-09-06 NOTE — Progress Notes (Signed)
ANTICOAGULATION CONSULT NOTE - Follow Up Consult  Pharmacy Consult for coumadin Indication: hx of DVT/PE  No Known Allergies  Patient Measurements: Height: 5\' 7"  (170.2 cm) Weight: 263 lb 14.3 oz (119.7 kg) IBW/kg (Calculated) : 66.1 Heparin Dosing Weight:   Vital Signs: Temp: 98.4 F (36.9 C) (11/11 0654) Temp Source: Oral (11/11 0654) BP: 109/54 mmHg (11/11 0654) Pulse Rate: 72 (11/11 0654)  Labs:  Recent Labs  09/04/14 0354 09/05/14 0457  LABPROT 20.6* 22.0*  INR 1.75* 1.90*    Estimated Creatinine Clearance: 117 mL/min (by C-G formula based on Cr of 0.76).   Medications:  Scheduled:  . antiseptic oral rinse  7 mL Mouth Rinse BID  . azelastine  1 spray Each Nare Daily   And  . fluticasone  1 spray Each Nare Daily  . ciprofloxacin  250 mg Oral BID  . docusate sodium  100 mg Oral BID  . ezetimibe-simvastatin  1 tablet Oral Daily  . glimepiride  1 mg Oral Q breakfast  . insulin aspart  0-15 Units Subcutaneous TID WC  . OxyCODONE  15 mg Oral Q12H  . polyethylene glycol  17 g Oral Daily  . pregabalin  50 mg Oral TID  . triamterene-hydrochlorothiazide  1 tablet Oral Daily  . Warfarin - Pharmacist Dosing Inpatient   Does not apply q1800   Infusions:    Assessment: 63 yo male with hx of DVT/PE is currently on subthearpeutic coumadin.  INR today is down to 1.84.  Patient is on cipro for UTI which may affect INR.   Goal of Therapy:  INR 2-3 Monitor platelets by anticoagulation protocol: Yes   Plan:  - Coumadin 8 mg x 1 because expecting cipro to interact with coumadin soon - Daily PT / INR  Shaily Librizzi, Tsz-Yin 09/06/2014,8:10 AM

## 2014-09-06 NOTE — Patient Care Conference (Signed)
Inpatient RehabilitationTeam Conference and Plan of Care Update Date: 09/06/2014   Time: 11;25 AM    Patient Name: Joshua SledgeGary E Henshaw      Medical Record Number: [id number]  Date of Birth: 10/27/1951 Sex: Male         Room/Bed: 4M05C/4M05C-01 Payor Info: Payor: MEDICARE / Plan: MEDICARE PART A AND B / Product Type: *No Product type* /    Admitting Diagnosis: L Tibia Fx  Admit Date/Time:  08/30/2014  5:46 PM Admission Comments: No comment available   Primary Diagnosis:  <principal problem not specified> Principal Problem: <principal problem not specified>  Patient Active Problem List   Diagnosis Date Noted  . Pain and swelling of lower leg   . Trauma 08/30/2014  . Fracture of tibial shaft, closed   . Fracture of distal end of tibia with fibula   . Fall from tree   . Tibial fracture 08/27/2014  . Abn Findings-GI Tract 08/15/2008  . PULMONARY EMBOLISM 03/17/2008  . DM 03/09/2008  . HYPERLIPIDEMIA 03/09/2008  . HYPERTENSION 03/09/2008  . ALLERGIC RHINITIS 03/09/2008  . HEADACHE, CHRONIC 03/09/2008  . DYSPNEA 03/09/2008    Expected Discharge Date: Expected Discharge Date: 09/08/14  Team Members Present: Physician leading conference: Dr. [name] Social Worker Present: Dossie DerBecky Caila Cirelli, LCSW Nurse Present: Carlean PurlMaryann Barbour, RN PT Present: Harriet ButteEmily Parcell, PT;Marlowe Lawes Evelina BucyVarner, PT OT Present: Callie FieldingKatie Pittman, Heath LarkT;James McGuire, OT SLP Present: Fae PippinMelissa Bowie, SLP PPS Coordinator present : Tora DuckMarie Noel, RN, CRRN     Current Status/Progress Goal Weekly Team Focus  Medical   Left lower extremity swelling and pain have  improved, Doppler negative,  obesity, chronic pain low back  Adequate pain relief for full pertussis patient in therapy program  Edema control left lower extremity   Bowel/Bladder   Continent of bowel and bladder. LBM 09/04/14  Managed bowel and bladder  Monitor   Swallow/Nutrition/ Hydration     WFL        ADL's   Bathing and dressing at sink with supervision, UB  dressing and grooming with Mod I, STS  and transfers with close supervision  Mod I, supervision for shower transfer  shower transfer, endurance, dynamic standing balance, family and patient education, d/c planning   Mobility   min/guard to close S overall for bed mobility, w/c parts management, transfers and gait.  Continues to have limited endurance and increased pain limiting mobility.   mod I   actiivty tolerance, gait, stairs, D/C planning, pt/family education   Communication     NA        Safety/Cognition/ Behavioral Observations    No unsafe behaviors        Pain   Scheduled Oxycodone 15mg  q 12hrs, Oxy IR 15mg  q 4hrs prn (pt request q 4hrs )  <6  Monitor for effectiveness. Introduce non-therapeutic technique   Skin   L knee incision x 3 with sutures, healing appropriately  No additional skin breakdown  Encourage routine turn q 2hrs      *See Care Plan and progress notes for long and short-term goals.  Barriers to Discharge: Still needs physical assistance, wife is tiny    Possible Resolutions to Barriers:  Continue rehabilitation program    Discharge Planning/Teaching Needs:  HOme with wife who can provide supervision level due to her small statue, attempting to get her in on Thurs to go through therapies so pt will be ready for dc Friday      Team Discussion:  Pain issues on-going, wants more. Endurance issues in  therapies, but making good progress. Goals set mod/i level.  Left leg less swollen -wounds look good-according to MD. Need wife in for family education-Thursday  Revisions to Treatment Plan:  None   Continued Need for Acute Rehabilitation Level of Care: The patient requires daily medical management by a physician with specialized training in physical medicine and rehabilitation for the following conditions: Daily direction of a multidisciplinary physical rehabilitation program to ensure safe treatment while eliciting the highest outcome that is of practical value to  the patient.: Yes Daily medical management of patient stability for increased activity during participation in an intensive rehabilitation regime.: Yes Daily analysis of laboratory values and/or radiology reports with any subsequent need for medication adjustment of medical intervention for : Post surgical problems;Other  Meila Berke, Lemar Livingsebecca G 09/06/2014, 2:29 PM

## 2014-09-06 NOTE — Progress Notes (Signed)
Social Work Patient ID: Joshua Fields, male   DOB: 31-Aug-1951, 63 y.o.   MRN: [id number] Met with pt to discuss team conference goals-mod/i and discharge date 11/13.  His wife is coming back tonight so she will be here tomorrow to attend therapies with pt. Pt is glad to have a discharge date and feels ready to be going home.  He has all of his equipment and feels it is working well, not too old.  Will meet with he and wife tomorrow and Answer any questions or address any concerns.

## 2014-09-06 NOTE — Progress Notes (Signed)
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 There was no loss of consciousness. INR on admission of 1.89.Cranial CT scan negative.CT abdomen and pelvis with numerous rib fractures and several compression fractures in the spine appear to be chronic and not acute in nature.X-rays and imaging showed a closed displaced left tibial shaft fracture as well as closed displaced comminuted left distal fibular fracture.Underwent open treatment of left tibial shaft fracture with intramedullary nailing closed treatment of left distal fibular fracture 08/28/2014 per Dr. Doran Durand.Weightbearing as tolerated left lower extremity with cam boot   Subjective/Complaints: Am I getting pain medicine when I leave hospital No SOB  Objective: Vital Signs: Blood pressure 109/54, pulse 72, temperature 98.4 F (36.9 C), temperature source Oral, resp. rate 18, height _0  (1.702 m), weight 119.7 kg (263 lb 14.3 oz), SpO2 100 %. Dg Chest 2 View  09/04/2014   CLINICAL DATA:  Fever for 1 day.  EXAM: CHEST  2 VIEW  COMPARISON:  08/27/2014.  FINDINGS: Cardiomegaly is unchanged. Lung volumes are mildly reduced. Bulla is present over the cardiopericardial silhouette on the lateral view. Spinal stimulator lead terminates over lower cervical ACDF plate. Thoracolumbar compression fracture is present which appears chronic.  No airspace disease or effusion identified.  Likely chronic LEFT-sided rib fractures again noted.  IMPRESSION: Cardiomegaly without failure.   Electronically Signed   By: Dereck Ligas M.D.   On: 09/04/2014 18:28   Results for orders placed or performed during the hospital encounter of 08/30/14 (from the past 72 hour(s))  Glucose, capillary     Status: Abnormal   Collection  Time: 09/03/14 11:40 AM  Result Value Ref Range   Glucose-Capillary 137 (H) 70 - 99 mg/dL  Glucose, capillary     Status: Abnormal   Collection Time: 09/03/14  4:33 PM  Result Value Ref Range   Glucose-Capillary 111 (H) 70 - 99 mg/dL  Glucose, capillary     Status: Abnormal   Collection Time: 09/03/14  9:36 PM  Result Value Ref Range   Glucose-Capillary 124 (H) 70 - 99 mg/dL  Protime-INR     Status: Abnormal   Collection Time: 09/04/14  3:54 AM  Result Value Ref Range   Prothrombin Time 20.6 (H) 11.6 - 15.2 seconds   INR 1.75 (H) 0.00 - 1.49  Glucose, capillary     Status: Abnormal   Collection Time: 09/04/14  6:27 AM  Result Value Ref Range   Glucose-Capillary 137 (H) 70 - 99 mg/dL  Glucose, capillary     Status: None   Collection Time: 09/04/14 11:50 AM  Result Value Ref Range   Glucose-Capillary 95 70 - 99 mg/dL   Comment 1 Notify RN   Glucose, capillary     Status: Abnormal   Collection Time: 09/04/14  2:26 PM  Result Value Ref Range   Glucose-Capillary 140 (H) 70 - 99 mg/dL  Urine culture     Status: None (Preliminary result)   Collection Time: 09/04/14  2:52 PM  Result Value Ref Range   Specimen Description URINE, CLEAN CATCH    Special Requests NONE    Culture  Setup Time      09/04/2014 22:51 Performed at [place]      >=100,000 COLONIES/ML Performed at [facility]  ESCHERICHIA COLI Performed at [facility]    Report Status PENDING   Urinalysis, Routine w reflex microscopic     Status: Abnormal   Collection Time: 09/04/14  2:52 PM  Result Value Ref Range   Color, Urine ORANGE (A) YELLOW    Comment: BIOCHEMICALS MAY BE AFFECTED BY COLOR   APPearance CLOUDY (A) CLEAR   Specific Gravity, Urine 1.026 1.005 - 1.030   pH 5.5 5.0 - 8.0   Glucose, UA NEGATIVE NEGATIVE mg/dL   Hgb urine dipstick MODERATE (A) NEGATIVE   Bilirubin Urine SMALL (A) NEGATIVE   Ketones, ur 15 (A) NEGATIVE mg/dL   Protein,  ur NEGATIVE NEGATIVE mg/dL   Urobilinogen, UA 4.0 (H) 0.0 - 1.0 mg/dL   Nitrite POSITIVE (A) NEGATIVE   Leukocytes, UA SMALL (A) NEGATIVE  Urine microscopic-add on     Status: Abnormal   Collection Time: 09/04/14  2:52 PM  Result Value Ref Range   Squamous Epithelial / LPF RARE RARE   WBC, UA 11-20 <3 WBC/hpf   RBC / HPF 0-2 <3 RBC/hpf   Bacteria, UA MANY (A) RARE  Culture, blood (routine x 2)     Status: None (Preliminary result)   Collection Time: 09/04/14  3:20 PM  Result Value Ref Range   Specimen Description BLOOD RIGHT ARM    Special Requests BOTTLES DRAWN AEROBIC AND ANAEROBIC 10CC    Culture  Setup Time      09/04/2014 19:47 Performed at [place] NO GROWTH TO DATE CULTURE WILL BE HELD FOR 5 DAYS BEFORE ISSUING A FINAL NEGATIVE REPORT Performed at [facility]    Report Status PENDING   Culture, blood (routine x 2)     Status: None (Preliminary result)   Collection Time: 09/04/14  3:30 PM  Result Value Ref Range   Specimen Description BLOOD LEFT ARM    Special Requests BOTTLES DRAWN AEROBIC AND ANAEROBIC 10CC    Culture  Setup Time      09/04/2014 19:47 Performed at [facility] TO DATE CULTURE WILL BE HELD FOR 5 DAYS BEFORE ISSUING A FINAL NEGATIVE REPORT Performed at [facility]    Report Status PENDING   Glucose, capillary     Status: Abnormal   Collection Time: 09/04/14  4:52 PM  Result Value Ref Range   Glucose-Capillary 134 (H) 70 - 99 mg/dL   Comment 1 Notify RN   Glucose, capillary     Status: Abnormal   Collection Time: 09/04/14  9:04 PM  Result Value Ref Range   Glucose-Capillary 134 (H) 70 - 99 mg/dL  Protime-INR     Status: Abnormal   Collection Time: 09/05/14  4:57 AM  Result Value Ref Range   Prothrombin Time 22.0 (H) 11.6 - 15.2 seconds   INR 1.90 (H) 0.00 - 1.49  Glucose, capillary     Status: Abnormal    Collection Time: 09/05/14  6:54 AM  Result Value Ref Range   Glucose-Capillary 101 (H) 70 - 99 mg/dL  Glucose, capillary     Status: Abnormal   Collection Time: 09/05/14 11:58 AM  Result Value Ref Range   Glucose-Capillary 122 (H) 70 - 99 mg/dL   Comment 1 Notify RN   Glucose, capillary  Status: None   Collection Time: 09/05/14  4:37 PM  Result Value Ref Range   Glucose-Capillary 97 70 - 99 mg/dL  Glucose, capillary     Status: Abnormal   Collection Time: 09/05/14  8:29 PM  Result Value Ref Range   Glucose-Capillary 140 (H) 70 - 99 mg/dL   Comment 1 Notify RN   Glucose, capillary     Status: None   Collection Time: 09/06/14  6:26 AM  Result Value Ref Range   Glucose-Capillary 98 70 - 99 mg/dL   Comment 1 Notify RN      HEENT: normal Cardio: RRR and no murmur Resp: CTA B/L and unlabored GI: BS positive and NT,ND, soft Extremity:  Pulses positive and No Edema Skin:   Other ACE on LLE Neuro: Alert/Oriented and Abnormal Motor 5/5 in BUE, 4+/5 RLE, 2- R HF, 3- KE and ankle DF limited by pain Musc/Skel:  Swelling Left toes, pain with AROM LLE, prox and distal incisions healing well, distal incisions with small amt of blood (couple drops),  Pain with movement.  ACE wrap bunched up around knee.   GEN  NAD   Assessment/Plan: 1. Functional deficits secondary to Left tib/fib fractures  which require 3+ hours per day of interdisciplinary therapy in a comprehensive inpatient rehab setting. Physiatrist is providing close team supervision and 24 hour management of active medical problems listed below. Physiatrist and rehab team continue to assess barriers to discharge/monitor patient progress toward functional and medical goals. Team conference today please see physician documentation under team conference tab, met with team face-to-face to discuss problems,progress, and goals. Formulized individual treatment plan based on medical history, underlying problem and comorbidities.FIM: FIM -  Bathing Bathing Steps Patient Completed: Chest, Right Arm, Left Arm, Abdomen, Front perineal area, Buttocks, Right upper leg, Left upper leg, Right lower leg (including foot) Bathing: 5: Supervision: Safety issues/verbal cues  FIM - Upper Body Dressing/Undressing Upper body dressing/undressing steps patient completed: Thread/unthread right sleeve of pullover shirt/dresss, Thread/unthread left sleeve of pullover shirt/dress, Put head through opening of pull over shirt/dress, Pull shirt over trunk Upper body dressing/undressing: 5: Set-up assist to: Obtain clothing/put away FIM - Lower Body Dressing/Undressing Lower body dressing/undressing steps patient completed: Thread/unthread right pants leg, Thread/unthread left pants leg, Pull pants up/down, Don/Doff right sock, Don/Doff right shoe Lower body dressing/undressing: 5: Supervision: Safety issues/verbal cues  FIM - Toileting Toileting steps completed by patient: Adjust clothing prior to toileting, Performs perineal hygiene, Adjust clothing after toileting Toileting Assistive Devices: Grab bar or rail for support Toileting: 4: Steadying assist  FIM - Radio producer Devices: Insurance account manager Transfers: 4-To toilet/BSC: Min A (steadying Pt. > 75%), 5-From toilet/BSC: Supervision (verbal cues/safety issues)  FIM - Control and instrumentation engineer Devices: Walker, Arm rests, Orthosis Bed/Chair Transfer: 5: Supine > Sit: Supervision (verbal cues/safety issues), 4: Bed > Chair or W/C: Min A (steadying Pt. > 75%), 5: Chair or W/C > Bed: Supervision (verbal cues/safety issues)  FIM - Locomotion: Wheelchair Distance: 70 Locomotion: Wheelchair: 2: Travels 50 - 149 ft with supervision, cueing or coaxing FIM - Locomotion: Ambulation Locomotion: Ambulation Assistive Devices: Administrator Ambulation/Gait Assistance: 5: Supervision Locomotion: Ambulation: 2: Travels 50 - 149 ft with supervision/safety  issues  Comprehension Comprehension Mode: Auditory Comprehension: 6-Follows complex conversation/direction: With extra time/assistive device  Expression Expression Mode: Verbal Expression: 6-Expresses complex ideas: With extra time/assistive device  Social Interaction Social Interaction Mode: Asleep Social Interaction: 4-Interacts appropriately 75 - 89% of the time -  Needs redirection for appropriate language or to initiate interaction.  Problem Solving Problem Solving: 4-Solves basic 75 - 89% of the time/requires cueing 10 - 24% of the time  Memory Memory Assistive Devices: Memory book Memory: 4-Recognizes or recalls 75 - 89% of the time/requires cueing 10 - 24% of the time  Medical Problem List and Plan: 1. Functional deficits secondary to Multitrauma after fall. Closed displaced left tibial shaft fracture with displaced comminuted left distal fibula fracture. Status post IM nailing. Weightbearing as tolerated 2.  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 Monitor for bleeding episodes, check vasc US, swelling and pain are better 3. Pain Management: Lyrica 50 mg 3 times a day, oxycodone as needed. Monitor with increased mobility, had hydrocodone prescribed by PCP Dr Molli Barrows  At [facility] 4. Mood: generally appropriate. Team to provide ego support as possible 5. Neuropsych: This patient is  capable of making decisions on his own behalf. 6. Skin/Wound Care: routine skin checks 7. Fluids/Electrolytes/Nutrition: Strict INO's. Follow-up chemistries. Provide nutritional supplements as needed 8. Diabetes mellitus with peripheral neuropathy. Amaryl 1 mg daily. Check blood sugar before meals and at bedtime. 9.Hypertension. Maxide 37.5-25 mg  Daily. Monitor with increased mobility 10.Hyperlipidemia. Continue Vytorin 11. Constipation , adjust bowel program, no evidence of ileus on exam or xray, enc fiber 12.  UTI on Cipro LOS  (Days) 7 A FACE TO FACE EVALUATION WAS PERFORMED  Joshua Fields E 09/06/2014, 7:38 AM

## 2014-09-06 NOTE — Plan of Care (Signed)
Problem: RH Ambulation Goal: LTG Patient will ambulate in home environment (PT) LTG: Patient will ambulate in home environment, # of feet with assistance (PT).  Gait distance goals downgraded due to decreased endurance and pain limiting mobility.

## 2014-09-06 NOTE — Progress Notes (Signed)
Physical Therapy Session Note  Patient Details  Name: Joshua SledgeGary E Adolph MRN: [id number] Date of Birth: 10/07/1951  Today's Date: 09/06/2014 PT Individual Time: 0830-0900 PT Individual Time Calculation (min): 30 min   Short Term Goals: Week 1:  PT Short Term Goal 1 (Week 1): =LTGs due to ELOS  Skilled Therapeutic Interventions/Progress Updates:   Pt received lying in bed, agreeable to therapy, but stating that he just got pain meds.  Pt continues to need max encouragement to continue to participate in timely manner.  Performed bed mobility at S level with leg lifter and min cues for safety.  Also had pt don cam boot prior to leaving room.  He was able to do on his own with min cues for correct placement.  Also able to don R sock and shoe independently.  Performed 45' gait x 1 rep with RW at S level with continued cues for upright posture, increasing step widths, esp on RLE and increasing step width.  Pt continues to state "I can't" to all suggested gait deficits.  Ended session with car transfer with RW at close S level.  He was able to bring LLE into car on his own with max verbal cues for proper hand placement.  He states he [place] two grab bars in [place] to use and "will be fine."  Pt transferred back to w/c and assisted back to room.  Left in w/c with all needs in reach.  Nurse tech notified he wants to transfer to recliner.   Therapy Documentation Precautions:  Precautions Precautions: Fall Required Braces or Orthoses: Other Brace/Splint Other Brace/Splint: CAM boot when walking Restrictions Weight Bearing Restrictions: Yes LLE Weight Bearing: Weight bearing as tolerated (with CAM boot) Other Position/Activity Restrictions: with CAM boot (per OP REPORT)   Vital Signs: Therapy Vitals Temp: 98.4 F (36.9 C) Temp Source: Oral Pulse Rate: 72 Resp: 18 BP: (!) 109/54 mmHg Oxygen Therapy SpO2: 100 % O2 Device: Not Delivered Pain: Pain Assessment Pain Assessment: 0-10 Pain Score: 5  Pain  Type: Acute pain Pain Location: Leg Pain Orientation: Left Pain Descriptors / Indicators: Sore Pain Intervention(s): Medication (See eMAR) (scheduled)  See FIM for current functional status  Therapy/Group: Individual Therapy  [name], Chany Woolworth Ann 09/06/2014, 9:32 AM

## 2014-09-06 NOTE — Progress Notes (Signed)
Physical Therapy Session Note  Patient Details  Name: Joshua SledgeGary E Kindler MRN: [id number] Date of Birth: 07/27/1951  Today's Date: 09/06/2014 PT Individual Time: 1300-1400 PT Individual Time Calculation (min): 60 min   Short Term Goals: Week 1:  PT Short Term Goal 1 (Week 1): =LTGs due to ELOS  Skilled Therapeutic Interventions/Progress Updates:   Pt received lying in recliner, asleep, but was easily aroused with voice and light touch.  Pt continues to increase communication and delay therapy with max cues to increase participation and decrease conversation.  Had pt don cam boot on his own with very min cues this afternoon compared to this afternoon.  Provided cues to use UEs rather than leg lifter to place foot into boot.  Again, pt find excuses to perform task the way he wants to.  Skilled session focused on w/c mobility downstairs for increased independence and overall strengthening and endurance.  Also worked on gait in outdoor environment to further challenge balance and endurance and practiced negotiating up/down curb step for community re-entry.  Pt propelled to/from 1st floor outside to old main entrance >300' at mod I level.  Pt able to manage w/c parts and brakes prior to and following standing/gait activity.  Performed 40' x 1 reps with RW up/down incline/uneven surfaces at S level.  Provided mod cues for negotiating curb step with RW, but performed at close S level.  While resting, continue to provide education on importance of increasing WB through LLE for bone healing.  Pt continues to decline putting weight through leg, stating that he will fall, despite other max cues for safety, esp when performing stairs.   Demonstrated how unsafe it can be to have all weight through RLE and trying to advance RLE up to step.  Pt continued to argue with pt, stating, "I know how I have to do this."  Pt ambulated back to w/c as stated above.  Pt self propelled back to rehab floor and to room.  Transferred to bed  from w/c at S level.  Discussed making pt mod I in room tomorrow and that he would need to really work on safety while in room.  Pt able to perform bed mobility at mod I level using leg lifter.  Left with all needs in reach.    Therapy Documentation Precautions:  Precautions Precautions: Fall Required Braces or Orthoses: Other Brace/Splint Other Brace/Splint: CAM boot when walking Restrictions Weight Bearing Restrictions: Yes LLE Weight Bearing: Weight bearing as tolerated Other Position/Activity Restrictions: with CAM boot (per OP REPORT)  Pain: Pain Assessment Pain Assessment: 0-10 Pain Score: Asleep Pain Type: Acute pain Pain Location: Leg Pain Orientation: Left Pain Descriptors / Indicators: Aching Pain Intervention(s): Medication (See eMAR)   Locomotion : Ambulation Ambulation/Gait Assistance: 5: Supervision Wheelchair Mobility Distance: 200   See FIM for current functional status  Therapy/Group: Individual Therapy  [name], Granville Whitefield Ann 09/06/2014, 4:07 PM

## 2014-09-06 NOTE — Progress Notes (Signed)
Social Work Lucy Chrisebecca G Ova Meegan, LCSW Social Worker Signed  Patient Care Conference 09/06/2014  2:29 PM    Expand All Collapse All   Inpatient RehabilitationTeam Conference and Plan of Care Update Date: 09/06/2014   Time: 11;25 AM     Patient Name: Joshua Fields       Medical Record Number: [id number]  Date of Birth: 04/20/1951 Sex: Male         Room/Bed: 4M05C/4M05C-01 Payor Info: Payor: MEDICARE / Plan: MEDICARE PART A AND B / Product Type: *No Product type* /    Admitting Diagnosis: L Tibia Fx   Admit Date/Time:  08/30/2014  5:46 PM Admission Comments: No comment available   Primary Diagnosis:  <principal problem not specified> Principal Problem: <principal problem not specified>    Patient Active Problem List     Diagnosis  Date Noted   .  Pain and swelling of lower leg     .  Trauma  08/30/2014   .  Fracture of tibial shaft, closed     .  Fracture of distal end of tibia with fibula     .  Fall from tree     .  Tibial fracture  08/27/2014   .  Abn Findings-GI Tract  08/15/2008   .  PULMONARY EMBOLISM  03/17/2008   .  DM  03/09/2008   .  HYPERLIPIDEMIA  03/09/2008   .  HYPERTENSION  03/09/2008   .  ALLERGIC RHINITIS  03/09/2008   .  HEADACHE, CHRONIC  03/09/2008   .  DYSPNEA  03/09/2008     Expected Discharge Date: Expected Discharge Date: 09/08/14  Team Members Present: Physician leading conference: Dr. [name] Social Worker Present: Dossie DerBecky Ingra Rother, LCSW Nurse Present: Carlean PurlMaryann Barbour, RN PT Present: Harriet ButteEmily Parcell, PT;Tasia Liz Evelina BucyVarner, PT OT Present: Callie FieldingKatie Pittman, Heath LarkT;James McGuire, OT SLP Present: Fae PippinMelissa Bowie, SLP PPS Coordinator present : Tora DuckMarie Noel, RN, CRRN        Current Status/Progress  Goal  Weekly Team Focus   Medical     Left lower extremity swelling and pain have  improved, Doppler negative,  obesity, chronic pain low back   Adequate pain relief for full pertussis patient in therapy program  Edema control left lower extremity    Bowel/Bladder    Continent of bowel and bladder. LBM 09/04/14   Managed bowel and bladder  Monitor   Swallow/Nutrition/ Hydration       WFL         ADL's     Bathing and dressing at sink with supervision, UB dressing and grooming with Mod I, STS  and transfers with close supervision  Mod I, supervision for shower transfer   shower transfer, endurance, dynamic standing balance, family and patient education, d/c planning   Mobility     min/guard to close S overall for bed mobility, w/c parts management, transfers and gait.  Continues to have limited endurance and increased pain limiting mobility.   mod I   actiivty tolerance, gait, stairs, D/C planning, pt/family education   Communication       NA         Safety/Cognition/ Behavioral Observations      No unsafe behaviors         Pain     Scheduled Oxycodone 15mg  q 12hrs, Oxy IR 15mg  q 4hrs prn (pt request q 4hrs )  <6  Monitor for effectiveness. Introduce non-therapeutic technique    Skin     L knee incision x 3 with  sutures, healing appropriately  No additional skin breakdown  Encourage routine turn q 2hrs      *See Care Plan and progress notes for long and short-term goals.    Barriers to Discharge:  Still needs physical assistance, wife is tiny     Possible Resolutions to Barriers:   Continue rehabilitation program     Discharge Planning/Teaching Needs:   HOme with wife who can provide supervision level due to her small statue, attempting to get her in on Thurs to go through therapies so pt will be ready for dc Friday       Team Discussion:    Pain issues on-going, wants more. Endurance issues in therapies, but making good progress. Goals set mod/i level.  Left leg less swollen -wounds look good-according to MD. Need wife in for family education-Thursday   Revisions to Treatment Plan:    None    Continued Need for Acute Rehabilitation Level of Care: The patient requires daily medical management by a physician with specialized training in  physical medicine and rehabilitation for the following conditions: Daily direction of a multidisciplinary physical rehabilitation program to ensure safe treatment while eliciting the highest outcome that is of practical value to the patient.: Yes Daily medical management of patient stability for increased activity during participation in an intensive rehabilitation regime.: Yes Daily analysis of laboratory values and/or radiology reports with any subsequent need for medication adjustment of medical intervention for : Post surgical problems;Other  Lucy ChrisDupree, Shemekia Patane G 09/06/2014, 2:29 PM                  Patient ID: Joshua SledgeGary E Fields, male   DOB: 02/08/1951, 63 y.o.   MRN: [id number]

## 2014-09-07 ENCOUNTER — Inpatient Hospital Stay (HOSPITAL_COMMUNITY): Payer: PRIVATE HEALTH INSURANCE | Admitting: Rehabilitation

## 2014-09-07 ENCOUNTER — Inpatient Hospital Stay (HOSPITAL_COMMUNITY): Payer: Medicare Other | Admitting: Occupational Therapy

## 2014-09-07 DIAGNOSIS — B9620 Unspecified Escherichia coli [E. coli] as the cause of diseases classified elsewhere: Secondary | ICD-10-CM

## 2014-09-07 DIAGNOSIS — N390 Urinary tract infection, site not specified: Secondary | ICD-10-CM

## 2014-09-07 LAB — PROTIME-INR
INR: 1.85 — ABNORMAL HIGH (ref 0.00–1.49)
Prothrombin Time: 21.5 seconds — ABNORMAL HIGH (ref 11.6–15.2)

## 2014-09-07 LAB — GLUCOSE, CAPILLARY
GLUCOSE-CAPILLARY: 123 mg/dL — AB (ref 70–99)
GLUCOSE-CAPILLARY: 112 mg/dL — AB (ref 70–99)
GLUCOSE-CAPILLARY: 106 mg/dL — AB (ref 70–99)
Glucose-Capillary: 149 mg/dL — ABNORMAL HIGH (ref 70–99)

## 2014-09-07 MED ORDER — WARFARIN SODIUM 10 MG PO TABS
10.0000 mg | ORAL_TABLET | Freq: Once | ORAL | Status: AC
Start: 2014-09-07 — End: 2014-09-07
  Administered 2014-09-07: 10 mg via ORAL
  Filled 2014-09-07: qty 1

## 2014-09-07 NOTE — Discharge Instructions (Signed)
Inpatient Rehab Discharge Instructions  Joshua SledgeGary E Fields Discharge date and time: No discharge date for patient encounter.   Activities/Precautions/ Functional Status: Activity: activity as tolerated Diet: diabetic diet Wound Care: keep wound clean and dry Functional status:  ___ No restrictions     ___ Walk up steps independently ___ 24/7 supervision/assistance   ___ Walk up steps with assistance ___ Intermittent supervision/assistance  ___ Bathe/dress independently ___ Walk with walker     ___ Bathe/dress with assistance ___ Walk Independently    ___ Shower independently _x__ Walk with assistance    ___ Shower with assistance ___ No alcohol     ___ Return to work/school ________  Special Instructions:     COMMUNITY REFERRALS UPON DISCHARGE:    Home Health:   PT & RN  Agency:[facility] Phone:[phone]   Date of last service:09/08/2014  Medical Equipment/Items Ordered:HAS ALL EQUIPMENT FROM PREVIOUS SURGERIES AND FRACTURES     My questions have been answered and I understand these instructions. I will adhere to these goals and the provided educational materials after my discharge from the hospital.  Patient/Caregiver Signature _______________________________ Date __________  Clinician Signature _______________________________________ Date __________  Please bring this form and your medication list with you to all your follow-up doctor's appointments.

## 2014-09-07 NOTE — Progress Notes (Signed)
Occupational Therapy Discharge Summary and OT session 1 & 2  Patient Details  Name: SADIQ MCCAULEY MRN: [id number] Date of Birth: [birth year]  Today's Date: 09/07/2014 OT Individual [phone] and  [id number] OT Individual Time Calculation (min): 60 min and 60 min   Patient has met 8 of 8 long term goals due to improved activity tolerance, improved balance, improved awareness and improved coordination.  Patient to discharge at overall Modified independence and S for shower transfer level.  Patient's care partner is independent to provide the necessary supervision assistance at discharge.     Recommendation:  Pt does not need further OT intervention services following discharge for inpatient rehabilitation.   Equipment: recommended tub transfer bench  Reasons for discharge: treatment goals met  Patient/family agrees with progress made and goals achieved: Yes   OT Intervention Sessions: Session 1: Upon entering the room, pt supine in bed with 7/10 c/o pain and medication given to patient prior to therapist arrival. Pt utilized leg lifter in order to perform supine to sit with Mod I. Pt donned CAM boot independently to ambulate to shower with mod I and use of RW. Pt performed shower transfer onto TTB with supervision for safety and bathing with Mod I during session. Dressing performed while seated on EOB with Mod I for increased time or use of devices such as long handled reacher to tread pants and leg lifter as needed. Pt seated in recliner chair upon therapist exiting the room with call bell and all needed items within reach.   Session 2: Upon entering the room, pt seated in recliner chair with wife present in the room for family education. Pt ambulated 1 bout of 20' with RW at Mod I. Pt stopping at RN station to ask for pain medication which was distributed secondary to pt reporting 9/10 pain in L LE. OT educated and demonstrated to caregiver safe assistance for spouse when ambulating up and  down stars. Patient and wife returning demonstration with 100% accuracy. Pt propelled self in wheelchair ~ 400 feet to gift shop with BUEs in order to increase endurance. Once at gift shop, pt navigating aisles with RW and turning around in small spaces for community integration practice. Pt ambulation ~ 30 feet with RW and Mod I. OT assisted pt back to room as pt continued to report increased pain. Pt transferred to bed with Mod I and therapist applied ice to L LE in order to decrease edema and pain. Pt with call bell and wife in room upon exiting.   OT Discharge Precautions/Restrictions  Precautions Precautions: Fall Required Braces or Orthoses: Other Brace/Splint Other Brace/Splint: CAM boot donned when walking Restrictions Weight Bearing Restrictions: Yes LLE Weight Bearing: Weight bearing as tolerated Other Position/Activity Restrictions: with CAM boot (per OP REPORT)  Pain Assessment Pain Assessment: 0-10 Pain Score: 9  Pain Type: Surgical pain Pain Location: Leg Pain Orientation: Left Pain Descriptors / Indicators: Aching Pain Onset: On-going Patients Stated Pain Goal: 4 Pain Intervention(s): Repositioned;RN made aware;Medication (See eMAR) Multiple Pain Sites: No Vision/Perception  Vision- History Baseline Vision/History: No visual deficits Patient Visual Report: No change from baseline Vision- Assessment Vision Assessment?: No apparent visual deficits  Cognition Overall Cognitive Status: Within Functional Limits for tasks assessed Arousal/Alertness: Awake/alert Orientation Level: Oriented X4 Attention: Alternating Selective Attention: Appears intact Alternating Attention: Appears intact Memory: Appears intact Awareness: Appears intact Problem Solving: Appears intact Safety/Judgment: Appears intact Sensation Sensation Light Touch: Appears Intact Stereognosis: Not tested Hot/Cold: Appears Intact Proprioception: Appears Intact Coordination Gross  Motor Movements  are Fluid and Coordinated: Yes Fine Motor Movements are Fluid and Coordinated: Yes Motor  Motor Motor - Discharge Observations: Pt continues to have increased pain in L LE limited functional mobility and standing tolerance. Mobility  Bed Mobility Bed Mobility: Supine to Sit;Sit to Supine Supine to Sit: 6: Modified independent (Device/Increase time) Sit to Supine: 6: Modified independent (Device/Increase time) Transfers Transfers: Sit to Stand;Stand to Sit Sit to Stand: 6: Modified independent (Device/Increase time) Stand to Sit: 6: Modified independent (Device/Increase time)  Trunk/Postural Assessment  Cervical Assessment Cervical Assessment: Within Functional Limits Thoracic Assessment Thoracic Assessment: Exceptions to [facility] (rounded shoulders) Lumbar Assessment Lumbar Assessment: Exceptions to WFL (decreased ROM) Postural Control Postural Control: Deficits on evaluation  Balance Balance Balance Assessed: Yes Dynamic Sitting Balance Dynamic Sitting - Balance Support: Feet supported;Left upper extremity supported Dynamic Sitting - Level of Assistance: 6: Modified independent (Device/Increase time) Dynamic Standing Balance Dynamic Standing - Balance Support: Right upper extremity supported;During functional activity Dynamic Standing - Level of Assistance: 6: Modified independent (Device/Increase time) Dynamic Standing - Comments: B & D session Extremity/Trunk Assessment RUE Assessment RUE Assessment: Within Functional Limits LUE Assessment LUE Assessment: Within Functional Limits  See FIM for current functional status  Phineas Semen 09/07/2014, 4:47 PM

## 2014-09-07 NOTE — Progress Notes (Signed)
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 There was no loss of consciousness. INR on admission of 1.89.Cranial CT scan negative.CT abdomen and pelvis with numerous rib fractures and several compression fractures in the spine appear to be chronic and not acute in nature.X-rays and imaging showed a closed displaced left tibial shaft fracture as well as closed displaced comminuted left distal fibular fracture.Underwent open treatment of left tibial shaft fracture with intramedullary nailing closed treatment of left distal fibular fracture 08/28/2014 per Dr. Victorino DikeHewitt.Weightbearing as tolerated left lower extremity with cam boot   Subjective/Complaints: Am I leaving today? No SOB  Objective: Vital Signs: Blood pressure 144/56, pulse 65, temperature 97.9 F (36.6 C), temperature source Oral, resp. rate 17, height 5\' 7"  (1.702 m), weight 119.098 kg (262 lb 9 oz), SpO2 95 %. No results found. Results for orders placed or performed during the hospital encounter of 08/30/14 (from the past 72 hour(s))  Glucose, capillary     Status: None   Collection Time: 09/04/14 11:50 AM  Result Value Ref Range   Glucose-Capillary 95 70 - 99 mg/dL   Comment 1 Notify RN   Glucose, capillary     Status: Abnormal   Collection Time: 09/04/14  2:26 PM  Result Value Ref Range   Glucose-Capillary 140 (H) 70 - 99 mg/dL  Urine culture     Status: None (Preliminary result)   Collection Time: 09/04/14  2:52 PM  Result Value Ref Range   Specimen Description URINE, CLEAN CATCH    Special Requests NONE    Culture  Setup Time      09/04/2014 22:51 Performed at [facility]    Colony Count      >=100,000 COLONIES/ML [facility]formed at [facility]    Culture     ESCHERICHIA COLI Performed at [facility]    Report Status PENDING   Urinalysis, Routine w reflex microscopic     Status: Abnormal   Collection Time: 09/04/14  2:52 PM  Result Value Ref Range   Color, Urine ORANGE (A) YELLOW    Comment: BIOCHEMICALS MAY BE AFFECTED BY COLOR   APPearance CLOUDY (A) CLEAR   Specific Gravity, Urine 1.026 1.005 - 1.030   pH 5.5 5.0 - 8.0   Glucose, UA NEGATIVE NEGATIVE mg/dL   Hgb urine dipstick MODERATE (A) NEGATIVE   Bilirubin Urine SMALL (A) NEGATIVE   Ketones, ur 15 (A) NEGATIVE mg/dL   Protein, ur NEGATIVE NEGATIVE mg/dL   Urobilinogen, UA 4.0 (H) 0.0 - 1.0 mg/dL   Nitrite POSITIVE (A) NEGATIVE   Leukocytes, UA SMALL (A) NEGATIVE  Urine microscopic-add on     Status: Abnormal   Collection Time: 09/04/14  2:52 PM  Result Value Ref Range   Squamous Epithelial / LPF RARE RARE   WBC, UA 11-20 <3 WBC/hpf   RBC / HPF 0-2 <3 RBC/hpf   Bacteria, UA MANY (A) RARE  Culture, blood (routine x 2)     Status: None (Preliminary result)   Collection Time: 09/04/14  3:20 PM  Result Value Ref Range   Specimen Description BLOOD RIGHT ARM    Special Requests BOTTLES DRAWN AEROBIC AND ANAEROBIC 10CC    Culture  Setup Time      09/04/2014 19:47 Performed at [facility]    Culture  BLOOD CULTURE RECEIVED NO GROWTH TO DATE CULTURE WILL BE HELD FOR 5 DAYS BEFORE ISSUING A FINAL NEGATIVE REPORT Performed at [facility]    Report Status PENDING   Culture, blood (routine x 2)     Status: None (Preliminary result)   Collection Time: 09/04/14  3:30 PM  Result Value Ref Range   Specimen Description BLOOD LEFT ARM    Special Requests BOTTLES DRAWN AEROBIC AND ANAEROBIC 10CC    Culture  Setup Time      09/04/2014 19:47 Performed at [facility]    Culture             BLOOD CULTURE RECEIVED NO GROWTH TO DATE CULTURE WILL BE HELD FOR 5 DAYS BEFORE ISSUING A FINAL NEGATIVE REPORT Performed at [facility]     Report Status PENDING   Glucose, capillary     Status: Abnormal   Collection Time: 09/04/14  4:52 PM  Result Value Ref Range   Glucose-Capillary 134 (H) 70 - 99 mg/dL   Comment 1 Notify RN   Glucose, capillary     Status: Abnormal   Collection Time: 09/04/14  9:04 PM  Result Value Ref Range   Glucose-Capillary 134 (H) 70 - 99 mg/dL  Protime-INR     Status: Abnormal   Collection Time: 09/05/14  4:57 AM  Result Value Ref Range   Prothrombin Time 22.0 (H) 11.6 - 15.2 seconds   INR 1.90 (H) 0.00 - 1.49  Glucose, capillary     Status: Abnormal   Collection Time: 09/05/14  6:54 AM  Result Value Ref Range   Glucose-Capillary 101 (H) 70 - 99 mg/dL  Glucose, capillary     Status: Abnormal   Collection Time: 09/05/14 11:58 AM  Result Value Ref Range   Glucose-Capillary 122 (H) 70 - 99 mg/dL   Comment 1 Notify RN   Glucose, capillary     Status: None   Collection Time: 09/05/14  4:37 PM  Result Value Ref Range   Glucose-Capillary 97 70 - 99 mg/dL  Glucose, capillary     Status: Abnormal   Collection Time: 09/05/14  8:29 PM  Result Value Ref Range   Glucose-Capillary 140 (H) 70 - 99 mg/dL   Comment 1 Notify RN   Glucose, capillary     Status: None   Collection Time: 09/06/14  6:26 AM  Result Value Ref Range   Glucose-Capillary 98 70 - 99 mg/dL   Comment 1 Notify RN   Protime-INR     Status: Abnormal   Collection Time: 09/06/14  7:49 AM  Result Value Ref Range   Prothrombin Time 21.5 (H) 11.6 - 15.2 seconds   INR 1.84 (H) 0.00 - 1.49  Glucose, capillary     Status: Abnormal   Collection Time: 09/06/14 11:51 AM  Result Value Ref Range   Glucose-Capillary 112 (H) 70 - 99 mg/dL   Comment 1 Notify RN   Glucose, capillary     Status: Abnormal   Collection Time: 09/06/14  4:35 PM  Result Value Ref Range   Glucose-Capillary 118 (H) 70 - 99 mg/dL   Comment 1 Notify RN   Glucose, capillary     Status: Abnormal   Collection Time: 09/06/14  8:43 PM  Result Value Ref Range    Glucose-Capillary 112 (H) 70 - 99 mg/dL   Comment 1 Notify RN   Protime-INR     Status: Abnormal   Collection Time: 09/07/14  3:52 AM  Result Value Ref Range  Prothrombin Time 21.5 (H) 11.6 - 15.2 seconds   INR 1.85 (H) 0.00 - 1.49  Glucose, capillary     Status: Abnormal   Collection Time: 09/07/14  6:10 AM  Result Value Ref Range   Glucose-Capillary 123 (H) 70 - 99 mg/dL   Comment 1 Notify RN      HEENT: normal Cardio: RRR and no murmur Resp: CTA B/L and unlabored GI: BS positive and NT,ND, soft Extremity:  Pulses positive and No Edema Skin:   Other ACE on LLE Neuro: Alert/Oriented and Abnormal Motor 5/5 in BUE, 4+/5 RLE, 2- R HF, 3- KE and ankle DF limited by pain Musc/Skel:  Swelling Left toes, pain with AROM LLE, prox and distal incisions healing well, distal incisions with small amt of blood (couple drops),  Pain with movement.  ACE wrap bunched up around knee.   GEN  NAD   Assessment/Plan: 1. Functional deficits secondary to Left tib/fib fractures  which require 3+ hours per day of interdisciplinary therapy in a comprehensive inpatient rehab setting. Physiatrist is providing close team supervision and 24 hour management of active medical problems listed below. Physiatrist and rehab team continue to assess barriers to discharge/monitor patient progress toward functional and medical goals. Plan D/C in am FIM: FIM - Bathing Bathing Steps Patient Completed: Chest, Right Arm, Left Arm, Abdomen, Front perineal area, Buttocks, Right upper leg, Left upper leg, Right lower leg (including foot) Bathing: 5: Supervision: Safety issues/verbal cues  FIM - Upper Body Dressing/Undressing Upper body dressing/undressing steps patient completed: Thread/unthread right sleeve of pullover shirt/dresss, Thread/unthread left sleeve of pullover shirt/dress, Put head through opening of pull over shirt/dress, Pull shirt over trunk Upper body dressing/undressing: 6: More than reasonable amount of  time FIM - Lower Body Dressing/Undressing Lower body dressing/undressing steps patient completed: Thread/unthread right pants leg, Thread/unthread left pants leg, Pull pants up/down, Don/Doff right sock, Don/Doff right shoe Lower body dressing/undressing: 5: Supervision: Safety issues/verbal cues  FIM - Toileting Toileting steps completed by patient: Adjust clothing prior to toileting, Performs perineal hygiene, Adjust clothing after toileting Toileting Assistive Devices: Grab bar or rail for support Toileting: 5: Supervision: Safety issues/verbal cues  FIM - Diplomatic Services operational officer Devices: Art gallery manager Transfers: 5-To toilet/BSC: Supervision (verbal cues/safety issues), 5-From toilet/BSC: Supervision (verbal cues/safety issues)  FIM - Banker Devices: Walker, Arm rests, Orthosis Bed/Chair Transfer: 5: Supine > Sit: Supervision (verbal cues/safety issues), 4: Bed > Chair or W/C: Min A (steadying Pt. > 75%)  FIM - Locomotion: Wheelchair Distance: 200 Locomotion: Wheelchair: 6: Travels 150 ft or more, turns around, maneuvers to table, bed or toilet, negotiates 3% grade: maneuvers on rugs and over door sills independently FIM - Locomotion: Ambulation Locomotion: Ambulation Assistive Devices: Designer, industrial/product Ambulation/Gait Assistance: 5: Supervision Locomotion: Ambulation: 2: Travels 50 - 149 ft with supervision/safety issues  Comprehension Comprehension Mode: Auditory Comprehension: 6-Follows complex conversation/direction: With extra time/assistive device  Expression Expression Mode: Verbal Expression: 6-Expresses complex ideas: With extra time/assistive device  Social Interaction Social Interaction Mode: Asleep Social Interaction: 4-Interacts appropriately 75 - 89% of the time - Needs redirection for appropriate language or to initiate interaction.  Problem Solving Problem Solving: 4-Solves basic 75 - 89% of the  time/requires cueing 10 - 24% of the time  Memory Memory Assistive Devices: Memory book Memory: 4-Recognizes or recalls 75 - 89% of the time/requires cueing 10 - 24% of the time  Medical Problem List and Plan: 1. Functional deficits secondary to Multitrauma after fall.  Closed displaced left tibial shaft fracture with displaced comminuted left distal fibula fracture. Status post IM nailing. Weightbearing as tolerated 2.  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 Monitor for bleeding episodes, check vasc US, swelling and pain are better 3. Pain Management: Lyrica 50 mg 3 times a day, oxycodone as needed. Monitor with increased mobility, had hydrocodone prescribed by PCP Dr Aleatha Borer  At [facility] 4. Mood: generally appropriate. Team to provide ego support as possible 5. Neuropsych: This patient is  capable of making decisions on his own behalf. 6. Skin/Wound Care: routine skin checks 7. Fluids/Electrolytes/Nutrition: Strict INO's. Follow-up chemistries. Provide nutritional supplements as needed 8. Diabetes mellitus with peripheral neuropathy. Amaryl 1 mg daily. Check blood sugar before meals and at bedtime. 9.Hypertension. Maxide 37.5-25 mg  Daily. Monitor with increased mobility 10.Hyperlipidemia. Continue Vytorin 11. Constipation , adjust bowel program, no evidence of ileus on exam or xray, enc fiber 12.  UTI on Cipro LOS (Days) 8 A FACE TO FACE EVALUATION WAS PERFORMED  KIRSTEINS,ANDREW E 09/07/2014, 7:20 AM

## 2014-09-07 NOTE — Progress Notes (Signed)
Physical Therapy Discharge Summary  Patient Details  Name: Joshua Fields MRN: [id number] Date of Birth: 1951/01/21  Today's Date: 09/07/2014 PT Individual Time: 1000-1100 PT Individual Time Calculation (min): 60 min    Patient has met 9 of 9 long term goals due to improved activity tolerance, improved balance, increased strength, decreased pain, ability to compensate for deficits and improved awareness.  Patient to discharge at an ambulatory level Modified Independent.   Patient's care partner is independent to provide the necessary cognitive assistance for safety and min A on stairs at discharge.  Reasons goals not met: n/a  Recommendation:  Patient will benefit from ongoing skilled PT services in outpatient setting (if can get a ride there, and esp when allowed to come out of cam boot) to continue to advance safe functional mobility, address ongoing impairments in decreased balance and strength in LLE, and minimize fall risk.  Equipment: No equipment provided  Reasons for discharge: treatment goals met and discharge from hospital  Patient/family agrees with progress made and goals achieved: Yes   PT Treatment/Intervention:  Pt received lying in bed, agreeable to therapy session.  Performed bed mobility from hospital bed and ADL apt bed at mod I level with use of leg lifter to assist LLE into and out of bed.  Performed all sit<>stands and transfers at mod I level with RW in room and in ADL apt to bed and couch to simulate home.  Pt also able to ambulate 55' x 1 in controlled environment and in ADL at x 20' at mod I level.  Performed w/c mobility x 200' throughout session at mod I level and was able to manage w/c parts and leg rests on his own.  He was also able to don his cam boot on his own this morning without cues from therapist.  Performed car transfer at S level with min cues for safety of LLE.  Ended session with negotiation up/down 4, 6" steps with B handrails to simulate home entry.   Pt performed at min A level and was able to recall stepping sequence.  Wife and step daughter came into room once we returned to room.  Notified them both that he is independent in all tasks with RW, however will still need assist on the stairs.  Will notify OT to educate on this this afternoon during her session.  Family verbalized understanding.  Pt made Mod I in room with RW, RN and tech aware.    PT Discharge Precautions/Restrictions Precautions Precautions: Fall Required Braces or Orthoses: Other Brace/Splint Other Brace/Splint: CAM boot when walking Restrictions Weight Bearing Restrictions: Yes LLE Weight Bearing: Weight bearing as tolerated Other Position/Activity Restrictions: with CAM boot (per OP REPORT) Vital Signs Therapy Vitals Temp: 97.9 F (36.6 C) Temp Source: Oral Pulse Rate: 65 Resp: 17 BP: (!) 144/56 mmHg Oxygen Therapy SpO2: 95 % O2 Device: Not Delivered Pain Pain Assessment Pain Assessment: 0-10 Pain Score: 7  Pain Type: Acute pain Pain Location: Leg Pain Intervention(s): Medication (See eMAR) Vision/Perception     Cognition Overall Cognitive Status: Within Functional Limits for tasks assessed Arousal/Alertness: Awake/alert Orientation Level: Oriented X4 Attention: Alternating Selective Attention: Appears intact Alternating Attention: Appears intact Memory: Appears intact Awareness: Appears intact Problem Solving: Appears intact Safety/Judgment: Appears intact Sensation Sensation Light Touch: Appears Intact Stereognosis: Not tested Hot/Cold: Not tested Proprioception: Appears Intact Coordination Gross Motor Movements are Fluid and Coordinated: Yes Fine Motor Movements are Fluid and Coordinated: Yes Heel Shin Test: decreased/delayed due to pain Motor  Motor Motor - Discharge Observations: still with increased pain limiting mobility and WB through LLE.   Mobility Bed Mobility Bed Mobility: Supine to Sit;Sit to Supine Supine to Sit: 6:  Modified independent (Device/Increase time) Sit to Supine: 6: Modified independent (Device/Increase time) Transfers Transfers: Yes Sit to Stand: 6: Modified independent (Device/Increase time) Stand to Sit: 6: Modified independent (Device/Increase time) Stand Pivot Transfers: 6: Modified independent (Device/Increase time) Locomotion  Ambulation Ambulation: Yes Ambulation/Gait Assistance: 6: Modified independent (Device/Increase time) Ambulation Distance (Feet): 50 Feet Assistive device: Rolling walker Gait Gait: Yes Gait Pattern: Impaired Gait Pattern: Antalgic;Trunk flexed;Narrow base of support;Decreased stance time - left;Decreased step length - left;Decreased stride length;Decreased weight shift to left Stairs / Additional Locomotion Stairs: Yes Stairs Assistance: 4: Min assist Stairs Assistance Details: Verbal cues for sequencing;Verbal cues for technique;Verbal cues for precautions/safety;Manual facilitation for weight shifting Stair Management Technique: Two rails Number of Stairs: 4 Height of Stairs: 6 Wheelchair Mobility Wheelchair Mobility: Yes Wheelchair Assistance: 6: Modified independent (Device/Increase time) Environmental health practitioner: Both upper extremities Wheelchair Parts Management: Independent Distance: 200  Trunk/Postural Assessment  Cervical Assessment Cervical Assessment: Within Functional Limits Thoracic Assessment Thoracic Assessment: Exceptions to [facility] (forward, rounded shoulders) Lumbar Assessment Lumbar Assessment: Exceptions to WFL (stiff, decreased lumbar ROM) Postural Control Postural Control: Deficits on evaluation Postural Limitations: Pt with decreased flexibility in spine with decreased balance, esp during more dynamic tasks  Balance Static Sitting Balance Static Sitting - Balance Support: Feet supported;Bilateral upper extremity supported Static Sitting - Level of Assistance: 6: Modified independent (Device/Increase time) Dynamic Sitting  Balance Dynamic Sitting - Balance Support: Feet supported;Left upper extremity supported Dynamic Sitting - Level of Assistance: 6: Modified independent (Device/Increase time) Static Standing Balance Static Standing - Balance Support: During functional activity;Left upper extremity supported;Bilateral upper extremity supported Static Standing - Level of Assistance: 6: Modified independent (Device/Increase time) Dynamic Standing Balance Dynamic Standing - Balance Support: Right upper extremity supported;During functional activity Dynamic Standing - Level of Assistance: 6: Modified independent (Device/Increase time) Dynamic Standing - Balance Activities: Lateral lean/weight shifting;Forward lean/weight shifting Extremity Assessment        LLE Assessment LLE Assessment: Exceptions to [facility] LLE Strength LLE Overall Strength: Deficits;Due to pain LLE Overall Strength Comments: hip flex 3-/5, knee ext 3-/5, knee flex 2/5, did not test ankle due to pain  See FIM for current functional status  Denice Bors 09/07/2014, 12:34 PM

## 2014-09-07 NOTE — Progress Notes (Signed)
ANTICOAGULATION CONSULT NOTE - Follow Up Consult  Pharmacy Consult for coumadin Indication: hx of DVT/PE  No Known Allergies  Patient Measurements: Height: 5\' 7"  (170.2 cm) Weight: 262 lb 9 oz (119.098 kg) IBW/kg (Calculated) : 66.1 Heparin Dosing Weight:   Vital Signs: Temp: 97.9 F (36.6 C) (11/12 0532) Temp Source: Oral (11/12 0532) BP: 144/56 mmHg (11/12 0532) Pulse Rate: 65 (11/12 0532)  Labs:  Recent Labs  09/05/14 0457 09/06/14 0749 09/07/14 0352  LABPROT 22.0* 21.5* 21.5*  INR 1.90* 1.84* 1.85*    Estimated Creatinine Clearance: 116.7 mL/min (by C-G formula based on Cr of 0.76).   Medications:  Scheduled:  . antiseptic oral rinse  7 mL Mouth Rinse BID  . azelastine  1 spray Each Nare Daily   And  . fluticasone  1 spray Each Nare Daily  . ciprofloxacin  250 mg Oral BID  . docusate sodium  100 mg Oral BID  . ezetimibe-simvastatin  1 tablet Oral Daily  . glimepiride  1 mg Oral Q breakfast  . insulin aspart  0-15 Units Subcutaneous TID WC  . OxyCODONE  15 mg Oral Q12H  . polyethylene glycol  17 g Oral Daily  . pregabalin  50 mg Oral TID  . triamterene-hydrochlorothiazide  1 tablet Oral Daily  . Warfarin - Pharmacist Dosing Inpatient   Does not apply q1800   Infusions:    Assessment: 63 yo male with hx of DVT/PE is currently on subthearpeutic coumadin.  INR today is down to 1.85. Not moving much despite patient is also on cipro for UTI which may affect INR.   Goal of Therapy:  INR 2-3 Monitor platelets by anticoagulation protocol: Yes   Plan:  - Coumadin 10 mg x 1 - Daily PT / INR  Bayard HuggerMei Jonavon Trieu, PharmD, BCPS  Clinical Pharmacist  Pager: [phone]   09/07/2014,9:08 AM

## 2014-09-07 NOTE — Discharge Summary (Signed)
NAME:  Joshua Fields, Joshua Fields                  ACCOUNT NO.:  [id number]  MEDICAL RECORD NO.:  [id number]  LOCATION:  [id number]                        FACILITY:  [facility]  PHYSICIAN:  Joshua FieldsDATE OF BIRTH:  1950-12-17  DATE OF ADMISSION:  08/30/2014 DATE OF DISCHARGE:  09/08/2014                              DISCHARGE SUMMARY   DISCHARGE DIAGNOSES: 1. Functional deficits secondary to multitrauma with closed, displaced     left tibial shaft fracture with displaced comminuted left distal     fibular fracture, status post intramedullary nailing. 2. 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 3. Pain management. 4. Diabetes mellitus, peripheral neuropathy. 5. Hypertension. 6. Hyperlipidemia. 7. Constipation, resolved. 8. Escherichia coli urinary tract infection/ ESBL.  HISTORY OF PRESENT ILLNESS:  This is a 63 year old right-handed male with history of pulmonary emboli, DVT, maintained on chronic Coumadin, who was independent prior to admission living with his wife.  Admitted on August 27, 2014, after a fall from a tree approximately 10 feet, landing on his left leg.  There was no loss of consciousness.  INR on admission of 1.89.  Cranial CT scan negative.  CT of abdomen and pelvis with numerous rib fractures, several compression fractures in the spine, appeared to be chronic and not acute in nature.  X-rays and imaging showed closed, displaced left tibial shaft fracture as well as closed, displaced comminuted left distal fibular fracture.  Underwent open treatment of left tibial shaft fracture with intramedullary nailing. Closed treatment of left distal fibular fracture, August 28, 2014, per Joshua Fields.  Weightbearing as tolerated.  Hospital course pain management.  Acute blood loss anemia.  Monitored closely while on Coumadin.  The patient was admitted for comprehensive rehab program.  PAST MEDICAL HISTORY:  See discharge diagnoses.  SOCIAL  HISTORY:  Lives with spouse.  FUNCTIONAL HISTORY:  Prior to admission, independent.  FUNCTIONAL STATUS:  [facility], +2 physical assist sit to stand, moderate assist supine to sit, mod-to-max assist activities of daily living.  PHYSICAL EXAMINATION:  VITAL SIGNS:  Blood pressure 109/60, pulse 72, temperature 98, respirations 16. GENERAL:  This was an alert male, in no acute distress, oriented x3. LUNGS:  Clear to auscultation. CARDIAC:  Regular rate and rhythm. ABDOMEN:  Soft, nontender.  Good bowel sounds.  Surgical site clean and dry.  REHABILITATION HOSPITAL COURSE:  The patient was [facility] with therapies initiated on a 3-hour daily basis consisting of physical therapy, occupational therapy, and rehabilitation nursing.  The following issues were addressed during the patient's rehabilitation stay.  Pertaining to Joshua Fields's multitrauma after a fall, he was weightbearing as tolerated left lower extremity after left tibial shaft fracture, comminuted left distal fibular fracture and would follow up with [facility], Joshua Fields.  He remained on chronic Coumadin for history of pulmonary emboli.  Latest INR of 1.85, no bleeding episodes.  [facility] [id number].  Pain management with the use of Lyrica, oxycodone. He would follow up with his primary care provider.  Diabetes mellitus, peripheral neuropathy.  Blood sugars well controlled on Amaryl.  Blood pressure with no orthostatic changes, on Maxzide.  He had occasional constipation, resolved with laxative assistance.  E-coli  UTI /ESBL placed on Cipro and changed to macrobid X 10 days.  The patient received weekly collaborative interdisciplinary team conferences to discuss estimated length of stay, family teaching, any barriers to discharge. The patient ambulating with assistive device 40 feet, able to navigate up and down and incline  as well as unlabelled surfaces supervision. Ambulated back to his wheelchair supervision.  He was able to complete activities of daily living, needing some assistance for lower body dressing modified independence.  Full family teaching was completed with his wife, plan was for discharge to home with home health therapies.  DISCHARGE MEDICATIONS: 1. Macrobid 100 mg every 12 hour X 10 days. 2. Vytorin 10-40 one tab p.o. daily. 3. Amaryl 1 mg p.o. daily. 4. Oxycodone immediate release 15 mg p.o. every 4 hours as needed     severe pain. 5. OxyContin sustained release 15 mg p.o. every 12 hours. 6. Lyrica 50 mg p.o. t.i.d. 7. Maxzide 37.5-25 mg 1 p.o. daily. 8. Coumadin latest dose of 10 mg adjusted accordingly for an INR of     2.0 to 3.0 to be followed by Joshua Fields at [phone].  DIET:  Diabetic diet.  ACTIVITY:  He was weightbearing as tolerated.  He would follow up with Joshua Fields, [facility] 2 weeks call for appointment; [facility].  A home health nurse had been arranged for Coumadin therapy until the patient more mobile to be followed by Joshua Fields at [phone], fax number [phone].     Joshua Dollaraniel Joelie Fields, P.A.   ______________________________ Joshua Fields, M.D.    DA/MEDQ  D:  09/07/2014  T:  09/07/2014  Job:  [id number]  cc:   Joshua RoundJames Sloan Manning, MD [name], MD

## 2014-09-07 NOTE — Discharge Summary (Signed)
Discharge summary job # [id number]

## 2014-09-07 NOTE — Plan of Care (Signed)
Problem: RH PAIN MANAGEMENT Goal: RH STG PAIN MANAGED AT OR BELOW PT'S PAIN GOAL 3 or less on scale of 1-10  Outcome: Not Met (add Reason) Pt greater than 3

## 2014-09-07 NOTE — Progress Notes (Signed)
Social Work Patient ID: Joshua Fields, male   DOB: [birth year], 63 y.o.   MRN: [id number] Met with pt and wife to discuss discharge needs.  Pt has had Home Health in the past and wants this again.  Her prefers [facility].  He has all equipment according to his wife and himself.  Wife to go through family education today.  Both feel ready for discharge tomorrow.

## 2014-09-07 NOTE — Plan of Care (Signed)
Problem: RH Balance Goal: LTG Patient will maintain dynamic standing balance (PT) LTG: Patient will maintain dynamic standing balance with assistance during mobility activities (PT)  Outcome: Completed/Met Date Met:  09/07/14  Problem: RH Bed Mobility Goal: LTG Patient will perform bed mobility with assist (PT) LTG: Patient will perform bed mobility with assistance, with/without cues (PT).  Outcome: Completed/Met Date Met:  09/07/14  Problem: RH Bed to Chair Transfers Goal: LTG Patient will perform bed/chair transfers w/assist (PT) LTG: Patient will perform bed/chair transfers with assistance, with/without cues (PT).  Outcome: Completed/Met Date Met:  09/07/14  Problem: RH Car Transfers Goal: LTG Patient will perform car transfers with assist (PT) LTG: Patient will perform car transfers with assistance (PT).  Outcome: Completed/Met Date Met:  09/07/14  Problem: RH Furniture Transfers Goal: LTG Patient will perform furniture transfers w/assist (OT/PT LTG: Patient will perform furniture transfers with assistance (OT/PT).  Outcome: Completed/Met Date Met:  09/07/14  Problem: RH Ambulation Goal: LTG Patient will ambulate in controlled environment (PT) LTG: Patient will ambulate in a controlled environment, # of feet with assistance (PT).  Outcome: Completed/Met Date Met:  09/07/14 Goal: LTG Patient will ambulate in home environment (PT) LTG: Patient will ambulate in home environment, # of feet with assistance (PT).  Outcome: Completed/Met Date Met:  09/07/14  Problem: RH Wheelchair Mobility Goal: LTG Patient will propel w/c in controlled environment (PT) LTG: Patient will propel wheelchair in controlled environment, # of feet with assist (PT)  Outcome: Completed/Met Date Met:  09/07/14  Problem: RH Stairs Goal: LTG Patient will ambulate up and down stairs w/assist (PT) LTG: Patient will ambulate up and down # of stairs with assistance (PT)  Outcome: Completed/Met Date Met:   09/07/14

## 2014-09-07 NOTE — Plan of Care (Signed)
Problem: RH BOWEL ELIMINATION Goal: RH STG MANAGE BOWEL WITH ASSISTANCE STG Manage Bowel with modified independence  Outcome: Progressing Goal: RH STG MANAGE BOWEL W/MEDICATION W/ASSISTANCE STG Manage Bowel with Medication with Min Assistance.  Outcome: Progressing  Problem: RH SKIN INTEGRITY Goal: RH STG SKIN FREE OF INFECTION/BREAKDOWN Patient will have no new skin breakdown/infection while on rehab with min assist of caregiver  Outcome: Progressing Goal: RH STG MAINTAIN SKIN INTEGRITY WITH ASSISTANCE STG Maintain Skin Integrity With Min Assistance.  Outcome: Progressing  Problem: RH SAFETY Goal: RH STG ADHERE TO SAFETY PRECAUTIONS W/ASSISTANCE/DEVICE STG Adhere to Safety Precautions With Min Assist and Assistance/Device.  Outcome: Progressing  Problem: RH PAIN MANAGEMENT Goal: RH STG PAIN MANAGED AT OR BELOW PT'S PAIN GOAL 3 or less on scale of 1-10  Outcome: Progressing     

## 2014-09-07 NOTE — Plan of Care (Signed)
Problem: RH SAFETY Goal: RH STG ADHERE TO SAFETY PRECAUTIONS W/ASSISTANCE/DEVICE STG Adhere to Safety Precautions With Min Assist and Assistance/Device.  Outcome: Progressing     

## 2014-09-07 NOTE — Plan of Care (Signed)
Problem: RH BOWEL ELIMINATION Goal: RH STG MANAGE BOWEL WITH ASSISTANCE STG Manage Bowel with modified independence  Outcome: Completed/Met Date Met:  09/07/14

## 2014-09-07 NOTE — Plan of Care (Signed)
Problem: RH Balance Goal: LTG Patient will maintain dynamic standing with ADLs (OT) LTG: Patient will maintain dynamic standing balance with assist during activities of daily living (OT)  Outcome: Completed/Met Date Met:  09/07/14  Problem: RH Grooming Goal: LTG Patient will perform grooming w/assist,cues/equip (OT) LTG: Patient will perform grooming with assist, with/without cues using equipment (OT)  Outcome: Completed/Met Date Met:  09/07/14  Problem: RH Bathing Goal: LTG Patient will bathe with assist, cues/equipment (OT) LTG: Patient will bathe specified number of body parts with assist with/without cues using equipment (position) (OT)  Outcome: Completed/Met Date Met:  09/07/14  Problem: RH Dressing Goal: LTG Patient will perform upper body dressing (OT) LTG Patient will perform upper body dressing with assist, with/without cues (OT).  Outcome: Completed/Met Date Met:  09/07/14 Goal: LTG Patient will perform lower body dressing w/assist (OT) LTG: Patient will perform lower body dressing with assist, with/without cues in positioning using equipment (OT)  Outcome: Completed/Met Date Met:  09/07/14  Problem: RH Toileting Goal: LTG Patient will perform toileting w/assist, cues/equip (OT) LTG: Patient will perform toiletiing (clothes management/hygiene) with assist, with/without cues using equipment (OT)  Outcome: Completed/Met Date Met:  09/07/14  Problem: RH Toilet Transfers Goal: LTG Patient will perform toilet transfers w/assist (OT) LTG: Patient will perform toilet transfers with assist, with/without cues using equipment (OT)  Outcome: Completed/Met Date Met:  09/07/14  Problem: RH Tub/Shower Transfers Goal: LTG Patient will perform tub/shower transfers w/assist (OT) LTG: Patient will perform tub/shower transfers with assist, with/without cues using equipment (OT)  Outcome: Completed/Met Date Met:  09/07/14

## 2014-09-08 DIAGNOSIS — N390 Urinary tract infection, site not specified: Secondary | ICD-10-CM

## 2014-09-08 DIAGNOSIS — B9620 Unspecified Escherichia coli [E. coli] as the cause of diseases classified elsewhere: Secondary | ICD-10-CM

## 2014-09-08 LAB — URINE CULTURE

## 2014-09-08 LAB — GLUCOSE, CAPILLARY: GLUCOSE-CAPILLARY: 124 mg/dL — AB (ref 70–99)

## 2014-09-08 LAB — PROTIME-INR
INR: 1.9 — ABNORMAL HIGH (ref 0.00–1.49)
Prothrombin Time: 21.9 seconds — ABNORMAL HIGH (ref 11.6–15.2)

## 2014-09-08 MED ORDER — WARFARIN SODIUM 4 MG PO TABS
8.0000 mg | ORAL_TABLET | Freq: Every day | ORAL | Status: DC
  Filled 2014-09-08: qty 2

## 2014-09-08 MED ORDER — NITROFURANTOIN MONOHYD MACRO 100 MG PO CAPS: 100.0000 mg | ORAL_CAPSULE | Freq: Two times a day (BID) | ORAL | Status: AC

## 2014-09-08 MED ORDER — OXYCODONE HCL 15 MG PO TABS: 15.0000 mg | ORAL_TABLET | ORAL | Status: AC | PRN

## 2014-09-08 MED ORDER — NITROFURANTOIN MONOHYD MACRO 100 MG PO CAPS
100.0000 mg | ORAL_CAPSULE | Freq: Two times a day (BID) | ORAL | Status: DC
  Administered 2014-09-08: 100 mg via ORAL
  Filled 2014-09-08 (×3): qty 1

## 2014-09-08 MED ORDER — PREGABALIN 50 MG PO CAPS: 50.0000 mg | ORAL_CAPSULE | Freq: Three times a day (TID) | ORAL | Status: AC

## 2014-09-08 MED ORDER — OXYCODONE HCL ER 15 MG PO T12A: 15.0000 mg | EXTENDED_RELEASE_TABLET | Freq: Two times a day (BID) | ORAL | Status: AC

## 2014-09-08 MED ORDER — WARFARIN SODIUM 4 MG PO TABS: 8.0000 mg | ORAL_TABLET | Freq: Every day | ORAL | Status: AC

## 2014-09-08 NOTE — Plan of Care (Signed)
Problem: RH BOWEL ELIMINATION Goal: RH STG MANAGE BOWEL W/MEDICATION W/ASSISTANCE STG Manage Bowel with Medication with [place].  Outcome: Completed/Met Date Met:  09/08/14  Problem: RH SKIN INTEGRITY Goal: RH STG SKIN FREE OF INFECTION/BREAKDOWN Patient will have no new skin breakdown/infection while on rehab with min assist of caregiver  Outcome: Completed/Met Date Met:  09/08/14 Goal: RH STG MAINTAIN SKIN INTEGRITY WITH ASSISTANCE STG Maintain Skin Integrity With [place].  Outcome: Completed/Met Date Met:  09/08/14  Problem: RH SAFETY Goal: RH STG ADHERE TO SAFETY PRECAUTIONS W/ASSISTANCE/DEVICE STG Adhere to Safety Precautions With Min Assist and Assistance/Device.  Outcome: Completed/Met Date Met:  09/08/14  Problem: RH PAIN MANAGEMENT Goal: RH STG PAIN MANAGED AT OR BELOW PT'S PAIN GOAL 3 or less on scale of 1-10  Outcome: Completed/Met Date Met:  09/08/14

## 2014-09-08 NOTE — Plan of Care (Signed)
Problem: RH PAIN MANAGEMENT Goal: RH STG PAIN MANAGED AT OR BELOW PT'S PAIN GOAL 3 or less on scale of 1-10  Outcome: Progressing     

## 2014-09-08 NOTE — Progress Notes (Signed)
Patient and wife received written and verbal discharge instructions from Deatra Inaan Angiulli, [place]. Also reviewed incision care to left leg with patient and wife, clean daily pat dry and re wrap with ace wrap. Reviewed medications with them, denies any questions or concerns. Personal belongings packed by family. Patient taken down by NT via wheelchair. [place]on[place], Sha Burling Elon JesterMichele

## 2014-09-08 NOTE — Plan of Care (Signed)
Problem: RH BOWEL ELIMINATION Goal: RH STG MANAGE BOWEL W/MEDICATION W/ASSISTANCE STG Manage Bowel with Medication with Min Assistance.  Outcome: Progressing  Problem: RH SKIN INTEGRITY Goal: RH STG SKIN FREE OF INFECTION/BREAKDOWN Patient will have no new skin breakdown/infection while on rehab with min assist of caregiver  Outcome: Progressing Goal: RH STG MAINTAIN SKIN INTEGRITY WITH ASSISTANCE STG Maintain Skin Integrity With Min Assistance.  Outcome: Progressing  Problem: RH SAFETY Goal: RH STG ADHERE TO SAFETY PRECAUTIONS W/ASSISTANCE/DEVICE STG Adhere to Safety Precautions With Min Assist and Assistance/Device.  Outcome: Progressing

## 2014-09-08 NOTE — Progress Notes (Signed)
ANTICOAGULATION CONSULT NOTE - Follow Up Consult  Pharmacy Consult for coumadin Indication: hx of DVT/PE  No Known Allergies  Patient Measurements: Height: 5\' 7"  (170.2 cm) Weight: 262 lb 9 oz (119.098 kg) IBW/kg (Calculated) : 66.1 Heparin Dosing Weight:   Vital Signs: Temp: 97.5 F (36.4 C) (11/13 0500) Temp Source: Oral (11/13 0500) BP: 125/62 mmHg (11/13 0500) Pulse Rate: 66 (11/13 0500)  Labs:  Recent Labs  09/06/14 0749 09/07/14 0352 09/08/14 0544  LABPROT 21.5* 21.5* 21.9*  INR 1.84* 1.85* 1.90*    Estimated Creatinine Clearance: 116.7 mL/min (by C-G formula based on Cr of 0.76).   Medications:  Scheduled:  . antiseptic oral rinse  7 mL Mouth Rinse BID  . azelastine  1 spray Each Nare Daily   And  . fluticasone  1 spray Each Nare Daily  . docusate sodium  100 mg Oral BID  . ezetimibe-simvastatin  1 tablet Oral Daily  . glimepiride  1 mg Oral Q breakfast  . insulin aspart  0-15 Units Subcutaneous TID WC  . nitrofurantoin (macrocrystal-monohydrate)  100 mg Oral Q12H  . OxyCODONE  15 mg Oral Q12H  . polyethylene glycol  17 g Oral Daily  . pregabalin  50 mg Oral TID  . triamterene-hydrochlorothiazide  1 tablet Oral Daily  . warfarin  8 mg Oral q1800  . Warfarin - Pharmacist Dosing Inpatient   Does not apply q1800   Infusions:    Assessment: 63 yo male with hx of DVT/PE is currently on subtherapeutic coumadin.  INR is up a little to 1.9 after 10 mg last night. Off cipro  Goal of Therapy:  INR 2-3 Monitor platelets by anticoagulation protocol: Yes   Plan:  - Team wants to discharge patient on coumadin 8 mg po daily.  Rec to recheck INR on Monday to reassess dosing.  Patient may need higher dose.   Ara Grandmaison, Tsz-Yin 09/08/2014,8:17 AM

## 2014-09-08 NOTE — Progress Notes (Signed)
Social Work Discharge Note Discharge Note  The overall goal for the admission was met for:   Discharge location: Yes-HOME WITH WIFE WHO CAN PROVIDE SUPERVISION LEVEL  Length of Stay: Yes-9 DAYS  Discharge activity level: Yes-MOD/I LEVEL  Home/community participation: Yes  Services provided included: MD, RD, PT, OT, RN, CM, Pharmacy and [place]: Medicare and Private Insurance: [employer]  Follow-up services arranged: Home Health: [facility]-PT,RN and Patient/Family request agency HH: HAD IN THE PAST, DME: NONE NEEDED  Comments (or additional information):WIFE [place], [place]  Patient/Family verbalized understanding of follow-up arrangements: Yes  Individual responsible for coordination of the follow-up plan: WANDA-WIFE  Confirmed correct DME delivered: Elease Hashimoto 09/08/2014    Elease Hashimoto

## 2014-09-08 NOTE — Progress Notes (Signed)
Subjective/Complaints: Discussed ESBL in urine as well as D/C plans No SOB  Objective: Vital Signs: Blood pressure 104/62, pulse 77, temperature 98.8 F (37.1 C), temperature source Oral, resp. rate 18, height 5\' 7"  (1.702 m), weight 119.098 kg (262 lb 9 oz), SpO2 95 %. No results found. Results for orders placed or performed during the hospital encounter of 08/30/14 (from the past 72 hour(s))  Glucose, capillary     Status: Abnormal   Collection Time: 09/05/14 11:58 AM  Result Value Ref Range   Glucose-Capillary 122 (H) 70 - 99 mg/dL   Comment 1 Notify RN   Glucose, capillary     Status: None   Collection Time: 09/05/14  4:37 PM  Result Value Ref Range   Glucose-Capillary 97 70 - 99 mg/dL  Glucose, capillary     Status: Abnormal   Collection Time: 09/05/14  8:29 PM  Result Value Ref Range   Glucose-Capillary 140 (H) 70 - 99 mg/dL   Comment 1 Notify RN   Glucose, capillary     Status: None   Collection Time: 09/06/14  6:26 AM  Result Value Ref Range   Glucose-Capillary 98 70 - 99 mg/dL   Comment 1 Notify RN   Protime-INR     Status: Abnormal   Collection Time: 09/06/14  7:49 AM  Result Value Ref Range   Prothrombin Time 21.5 (H) 11.6 - 15.2 seconds   INR 1.84 (H) 0.00 - 1.49  Glucose, capillary     Status: Abnormal   Collection Time: 09/06/14 11:51 AM  Result Value Ref Range   Glucose-Capillary 112 (H) 70 - 99 mg/dL   Comment 1 Notify RN   Glucose, capillary     Status: Abnormal   Collection Time: 09/06/14  4:35 PM  Result Value Ref Range   Glucose-Capillary 118 (H) 70 - 99 mg/dL   Comment 1 Notify RN   Glucose, capillary     Status: Abnormal   Collection Time: 09/06/14  8:43 PM  Result Value Ref Range   Glucose-Capillary 112 (H) 70 - 99 mg/dL   Comment 1 Notify RN   Protime-INR     Status: Abnormal   Collection Time: 09/07/14  3:52 AM  Result Value Ref Range   Prothrombin Time 21.5 (H) 11.6 - 15.2 seconds   INR 1.85 (H) 0.00 - 1.49  Glucose, capillary      Status: Abnormal   Collection Time: 09/07/14  6:10 AM  Result Value Ref Range   Glucose-Capillary 123 (H) 70 - 99 mg/dL   Comment 1 Notify RN   Glucose, capillary     Status: Abnormal   Collection Time: 09/07/14 11:52 AM  Result Value Ref Range   Glucose-Capillary 112 (H) 70 - 99 mg/dL   Comment 1 Documented in Chart   Glucose, capillary     Status: Abnormal   Collection Time: 09/07/14  4:25 PM  Result Value Ref Range   Glucose-Capillary 106 (H) 70 - 99 mg/dL  Glucose, capillary     Status: Abnormal   Collection Time: 09/07/14  9:35 PM  Result Value Ref Range   Glucose-Capillary 149 (H) 70 - 99 mg/dL  Protime-INR     Status: Abnormal   Collection Time: 09/08/14  5:44 AM  Result Value Ref Range   Prothrombin Time 21.9 (H) 11.6 - 15.2 seconds   INR 1.90 (H) 0.00 - 1.49  Glucose, capillary     Status: Abnormal   Collection Time: 09/08/14  6:55 AM  Result Value Ref  Range   Glucose-Capillary 124 (H) 70 - 99 mg/dL     HEENT: normal Cardio: RRR and no murmur Resp: CTA B/L and unlabored GI: BS positive and NT,ND, soft Extremity:  Pulses positive and No Edema Skin:   Knee incision CDI with sutures intact Neuro: Alert/Oriented and Abnormal Motor 5/5 in BUE, 4+/5 RLE, 2- R HF, 3- KE and ankle DF limited by pain Musc/Skel:  Swelling Left toes, pain with AROM LLE, prox and distal incisions healing well, distal incisions with small amt of blood (couple drops),  Pain with movement.  ACE wrap bunched up around knee.   GEN  NAD   Assessment/Plan: 1. Functional deficits secondary to Left tib/fib fractures  Stable for D/C today F/u PCP in 1-2 weeks-Dr Meredith PelJ Manning F/u ortho in 1 wk-Dr Victorino DikeHewitt See D/C summary See D/C instructions FIM: FIM - Bathing Bathing Steps Patient Completed: Right Arm, Left Arm, Abdomen, Front perineal area, Buttocks, Right upper leg, Left upper leg, Right lower leg (including foot) Bathing: 6: More than reasonable amount of time  FIM - Upper Body  Dressing/Undressing Upper body dressing/undressing steps patient completed: Thread/unthread right sleeve of pullover shirt/dresss, Thread/unthread left sleeve of pullover shirt/dress, Put head through opening of pull over shirt/dress, Pull shirt over trunk Upper body dressing/undressing: 6: More than reasonable amount of time FIM - Lower Body Dressing/Undressing Lower body dressing/undressing steps patient completed: Thread/unthread right pants leg, Thread/unthread left pants leg, Pull pants up/down, Don/Doff right shoe, Don/Doff left shoe, Fasten/unfasten right shoe, Fasten/unfasten left shoe Lower body dressing/undressing: 6: More than reasonable amount of time  FIM - Toileting Toileting steps completed by patient: Adjust clothing prior to toileting, Performs perineal hygiene, Adjust clothing after toileting Toileting Assistive Devices: Grab bar or rail for support Toileting: 6: More than reasonable amount of time  FIM - Diplomatic Services operational officerToilet Transfers Toilet Transfers Assistive Devices: Art gallery managerWalker Toilet Transfers: 6-Assistive device: No helper  FIM - BankerBed/Chair Transfer Bed/Chair Transfer Assistive Devices: Environmental consultantWalker, Arm rests, Pension scheme managerrthosis Bed/Chair Transfer: 6: Supine > Sit: No assist, 6: Sit > Supine: No assist, 6: Chair or W/C > Bed: No assist, 6: Bed > Chair or W/C: No assist  FIM - Locomotion: Wheelchair Distance: 200 Locomotion: Wheelchair: 6: Travels 150 ft or more, turns around, maneuvers to table, bed or toilet, negotiates 3% grade: maneuvers on rugs and over door sills independently FIM - Locomotion: Ambulation Locomotion: Ambulation Assistive Devices: Designer, industrial/productWalker - Rolling Ambulation/Gait Assistance: 6: Modified independent (Device/Increase time) Locomotion: Ambulation: 2: Travels 50 - 149 ft with supervision/safety issues  Comprehension Comprehension Mode: Auditory Comprehension: 6-Follows complex conversation/direction: With extra time/assistive device  Expression Expression Mode:  Verbal Expression: 6-Expresses complex ideas: With extra time/assistive device  Social Interaction Social Interaction Mode: Asleep Social Interaction: 6-Interacts appropriately with others with medication or extra time (anti-anxiety, antidepressant).  Problem Solving Problem Solving: 5-Solves basic problems: With no assist  Memory Memory Assistive Devices: Memory book Memory: 5-Recognizes or recalls 90% of the time/requires cueing < 10% of the time  Medical Problem List and Plan: 1. Functional deficits secondary to Multitrauma after fall. Closed displaced left tibial shaft fracture with displaced comminuted left distal fibula fracture. Status post IM nailing. Weightbearing as tolerated 2.  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 Monitor for bleeding episodes, check vasc US, swelling and pain are better 3. Pain Management: Lyrica 50 mg 3 times a day, oxycodone as needed. Monitor with increased mobility, had hydrocodone prescribed by PCP Dr Aleatha [facility]orerJames Manning  [facility] 4. Mood:  generally appropriate. Team to provide ego support as possible 5. Neuropsych: This patient is  capable of making decisions on his own behalf. 6. Skin/Wound Care: routine skin checks 7. Fluids/Electrolytes/Nutrition: Strict INO's. Follow-up chemistries. Provide nutritional supplements as needed 8. Diabetes mellitus with peripheral neuropathy. Amaryl 1 mg daily. Check blood sugar before meals and at bedtime. 9.Hypertension. Maxide 37.5-25 mg  Daily. Monitor with increased mobility 10.Hyperlipidemia. Continue Vytorin 11. Constipation , adjust bowel program, no evidence of ileus on exam or xray, enc fiber 12.  UTI on Cipro LOS (Days) 9 A FACE TO FACE EVALUATION WAS PERFORMED  KIRSTEINS,ANDREW E 09/08/2014, 7:14 AM

## 2014-09-10 LAB — CULTURE, BLOOD (ROUTINE X 2)
CULTURE: NO GROWTH
Culture: NO GROWTH

## 2014-09-26 ENCOUNTER — Telehealth: Payer: Self-pay | Admitting: *Deleted

## 2014-09-26 NOTE — Telephone Encounter (Signed)
Joshua Fields called asking us to forward lab results to Dr. Kathrynn RunningManning, PT = 14, INR = 1.3, Coumidin Rx 4mg  mon-thurs,8mg  fri-sun, Lab results forwarded to Dr. Kathrynn RunningManning, transmission successful

## 2014-10-09 DIAGNOSIS — I2699 Other pulmonary embolism without acute cor pulmonale: Secondary | ICD-10-CM

## 2014-10-09 DIAGNOSIS — S82452D Displaced comminuted fracture of shaft of left fibula, subsequent encounter for closed fracture with routine healing: Secondary | ICD-10-CM

## 2014-10-09 DIAGNOSIS — Z5181 Encounter for therapeutic drug level monitoring: Secondary | ICD-10-CM

## 2014-10-09 DIAGNOSIS — E119 Type 2 diabetes mellitus without complications: Secondary | ICD-10-CM

## 2014-10-09 DIAGNOSIS — E785 Hyperlipidemia, unspecified: Secondary | ICD-10-CM

## 2014-10-09 DIAGNOSIS — Z7901 Long term (current) use of anticoagulants: Secondary | ICD-10-CM

## 2014-10-09 DIAGNOSIS — I10 Essential (primary) hypertension: Secondary | ICD-10-CM

## 2015-10-30 IMAGING — CR DG ABDOMEN 1V
2 series · 2 of 2 positions shown · non-contrast
Comparison: None.

CLINICAL DATA: Abdominal pain, nausea, diarrhea

EXAM:
ABDOMEN - 1 VIEW

[t abdomen supine (1 of 2)]
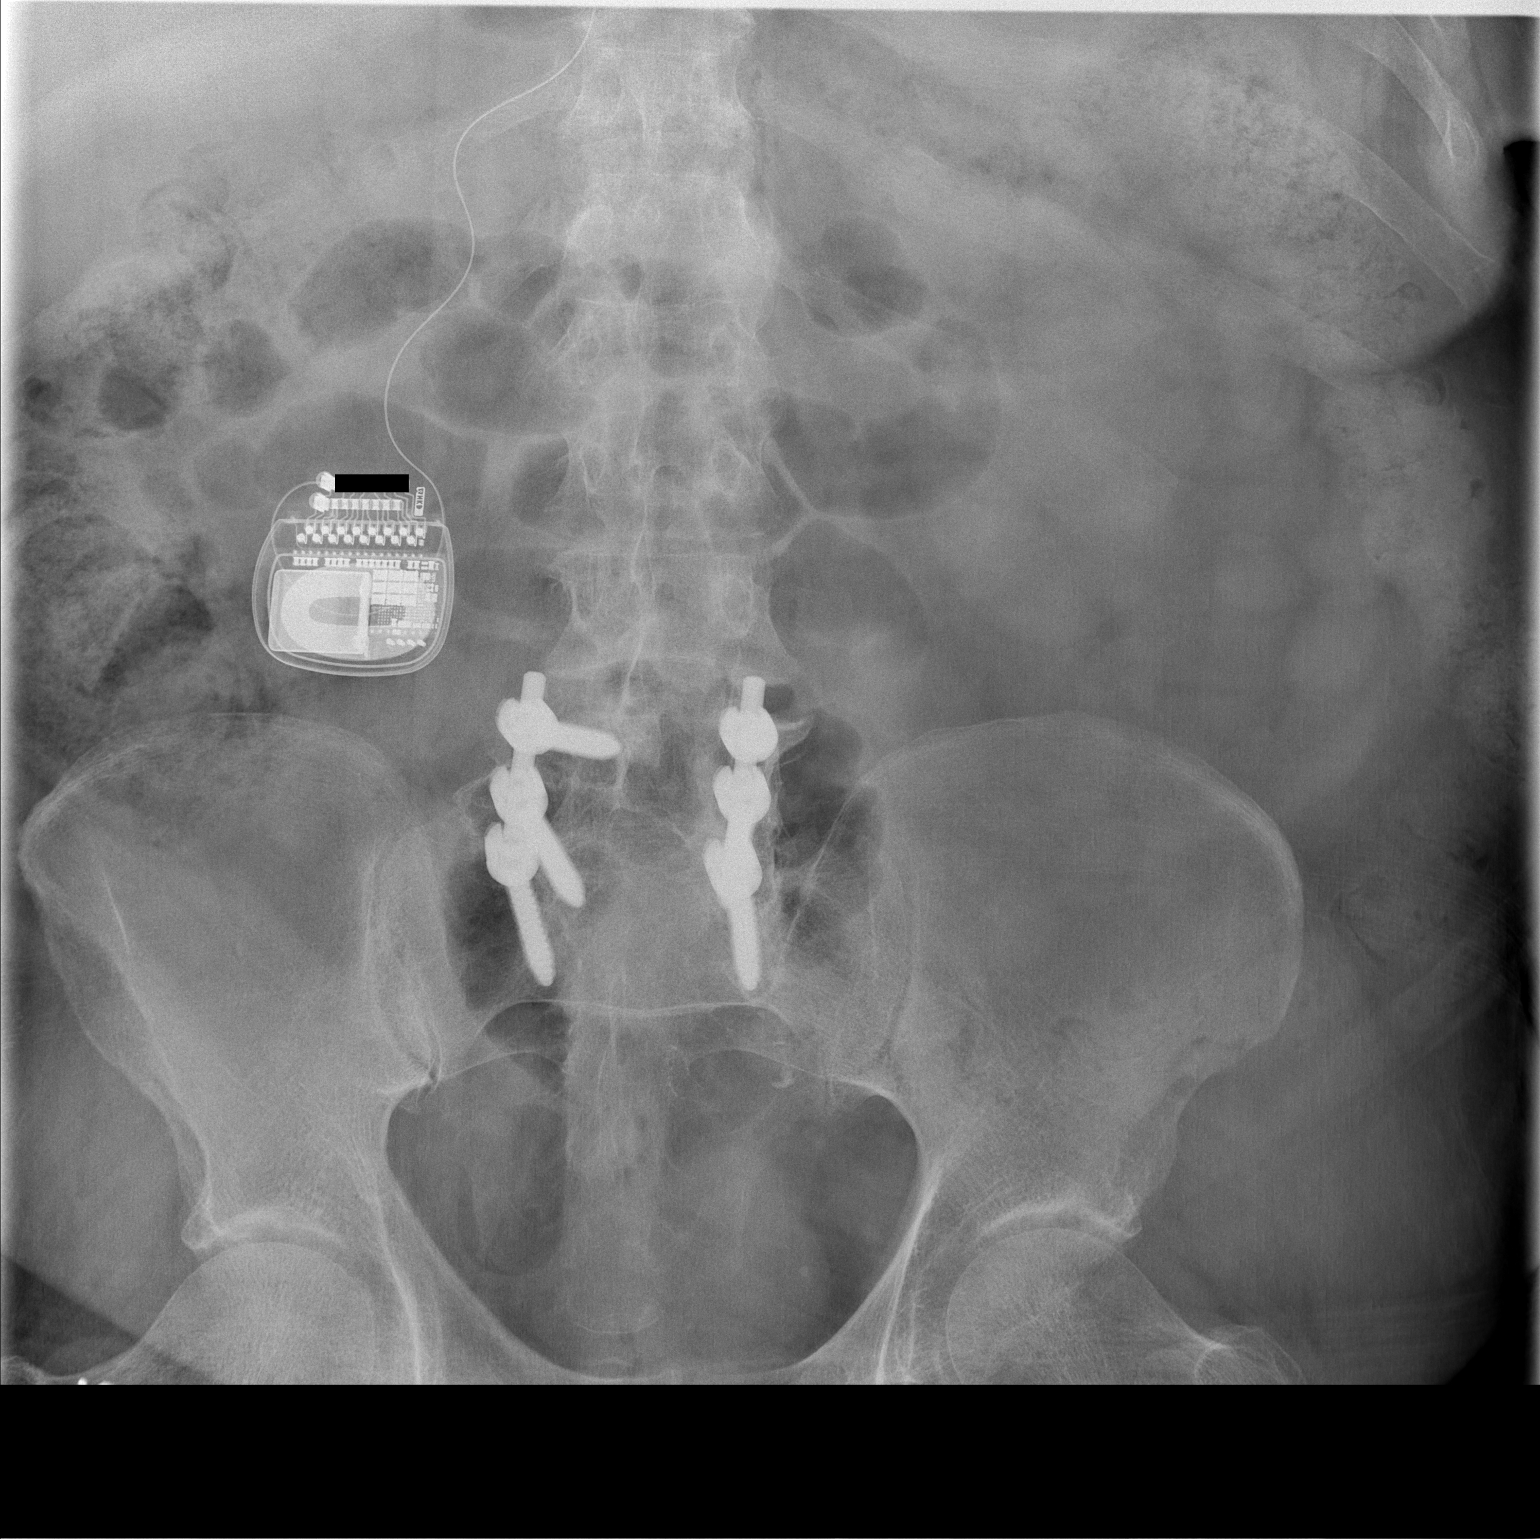

[t abdomen supine (2 of 2)]
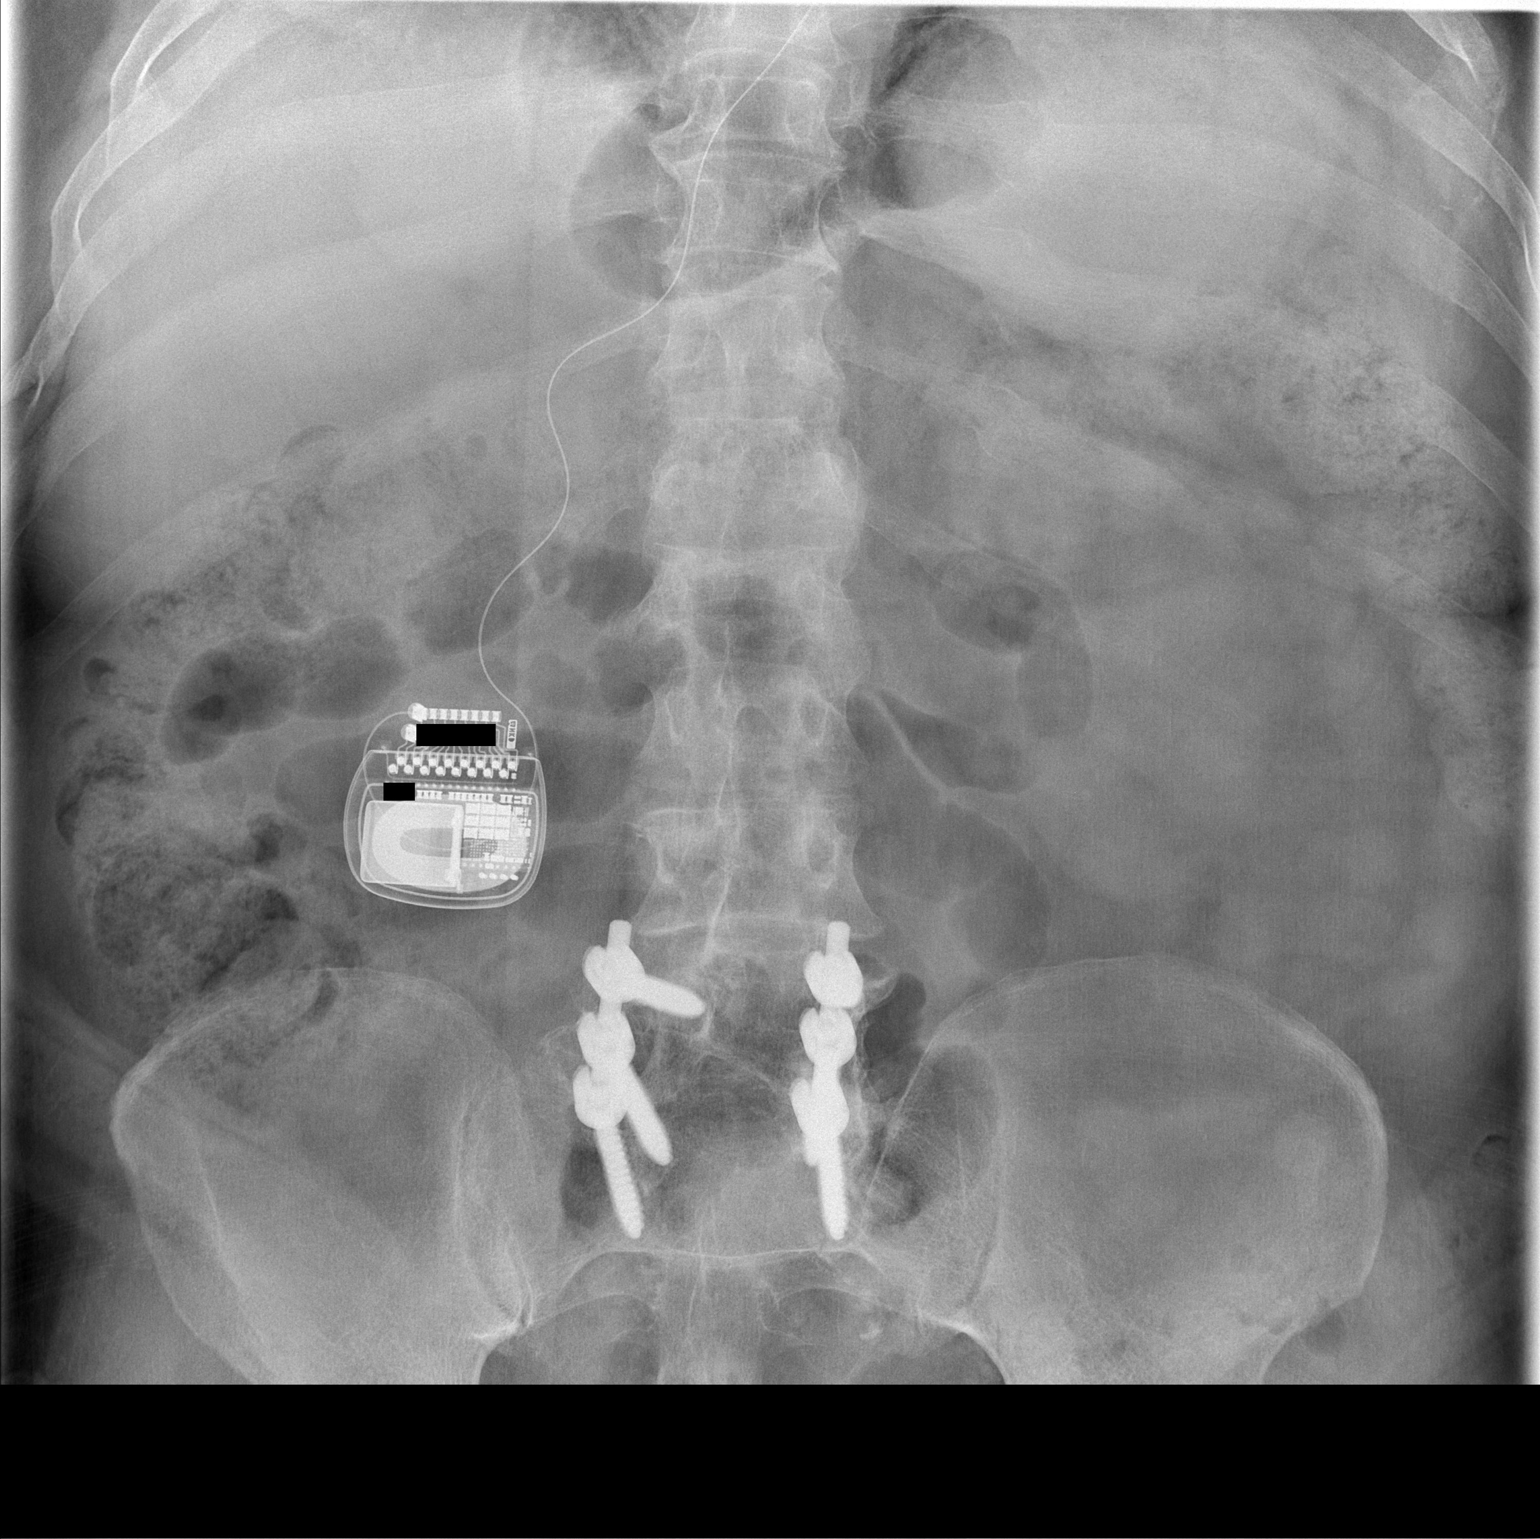

[2 of 2 positions shown; findings below may reference images not displayed]

FINDINGS: Nonspecific bowel gas pattern with multiple nondilated loops of
small bowel in the central abdomen. Colon is non-decompressed with a
moderate colonic stool burden.

Degenerative changes of the lumbar spine with lower lumbar spine
fixation hardware.

Thoracic spine stimulator.
IMPRESSION: Nonspecific but nonobstructive bowel gas pattern.

Moderate colonic stool burden.

## 2015-11-03 IMAGING — CR DG CHEST 2V
2 series · 2 of 2 positions shown · non-contrast
Comparison: 08/27/2014.

CLINICAL DATA: Fever for 1 day.

EXAM:
CHEST  2 VIEW

[w chest lat]
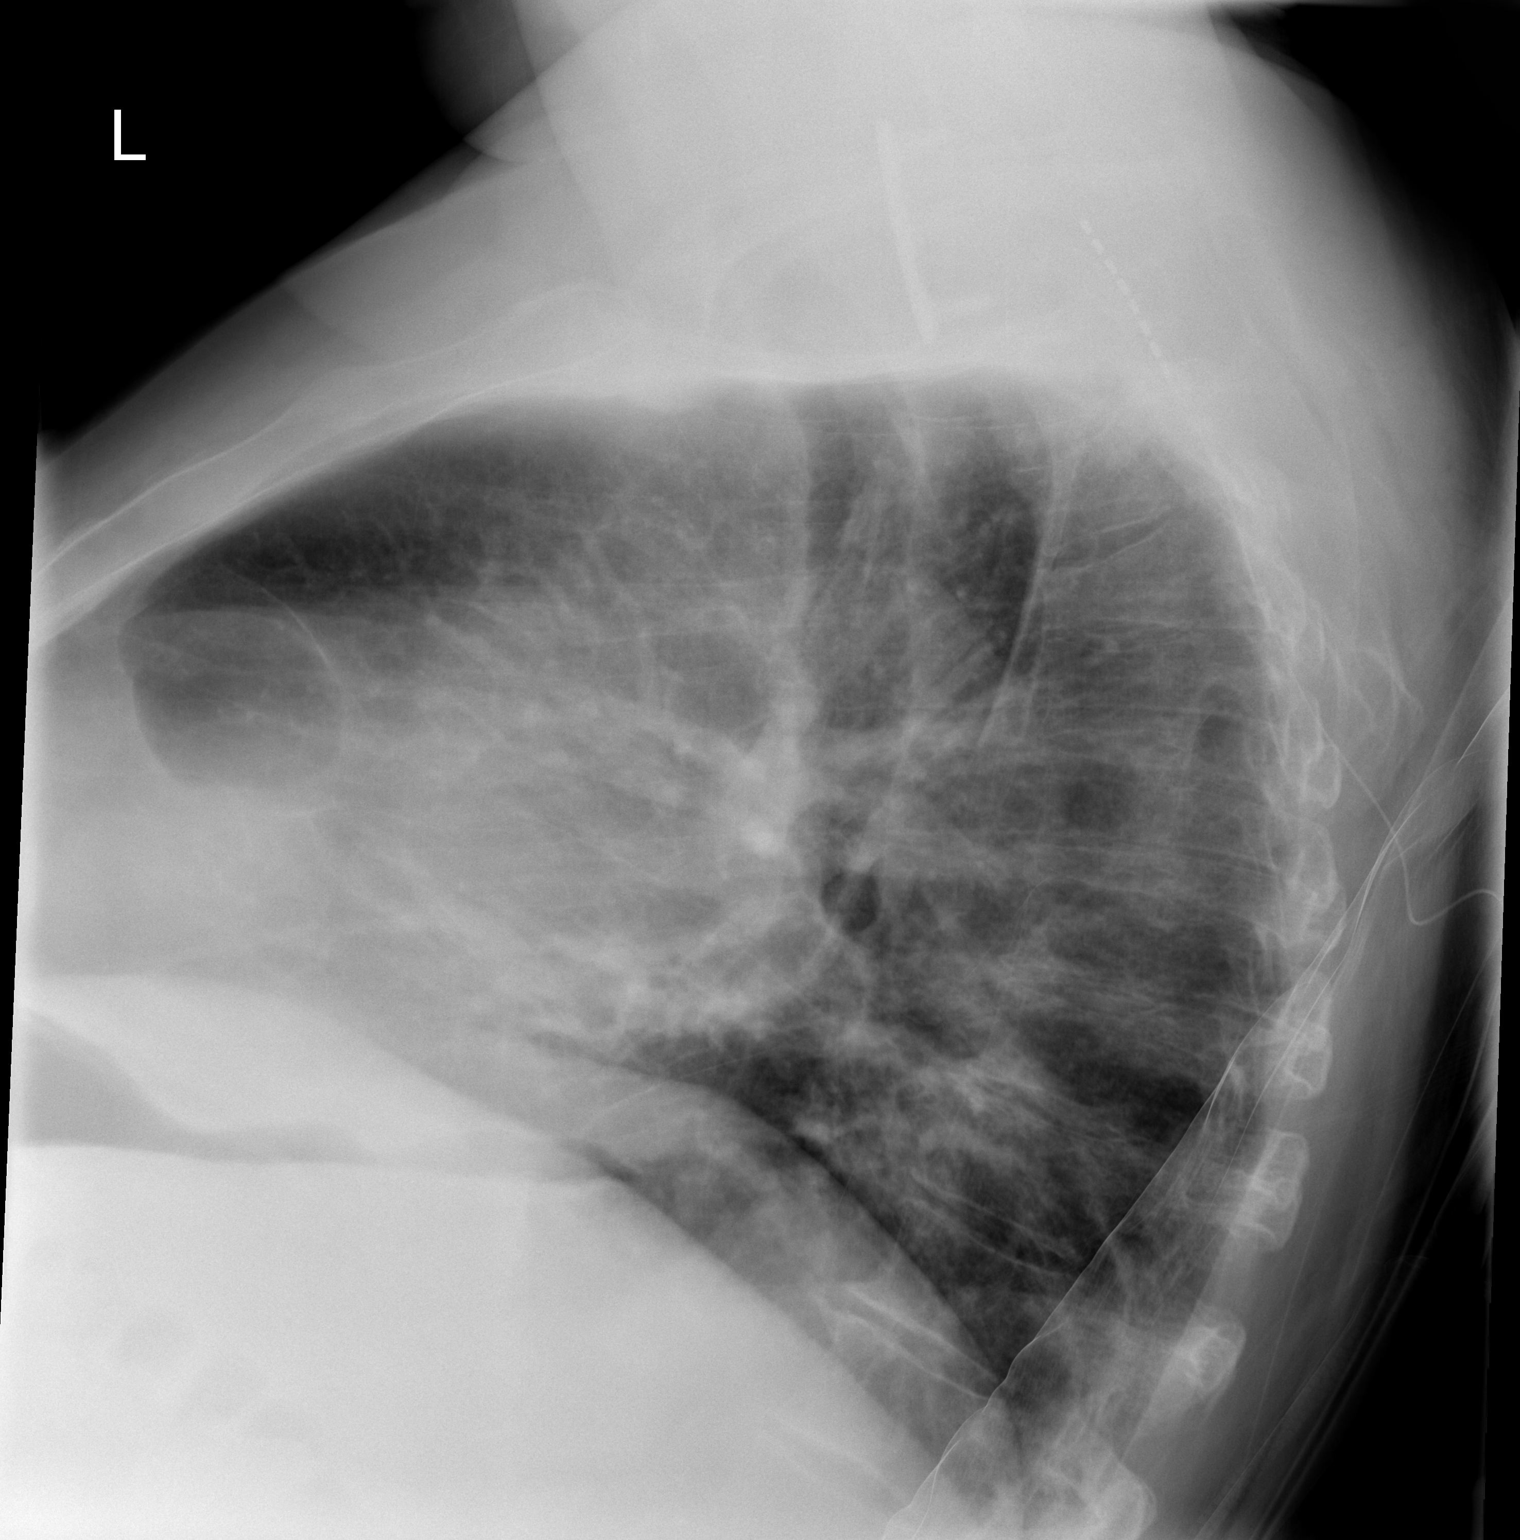

[w chest ap *]
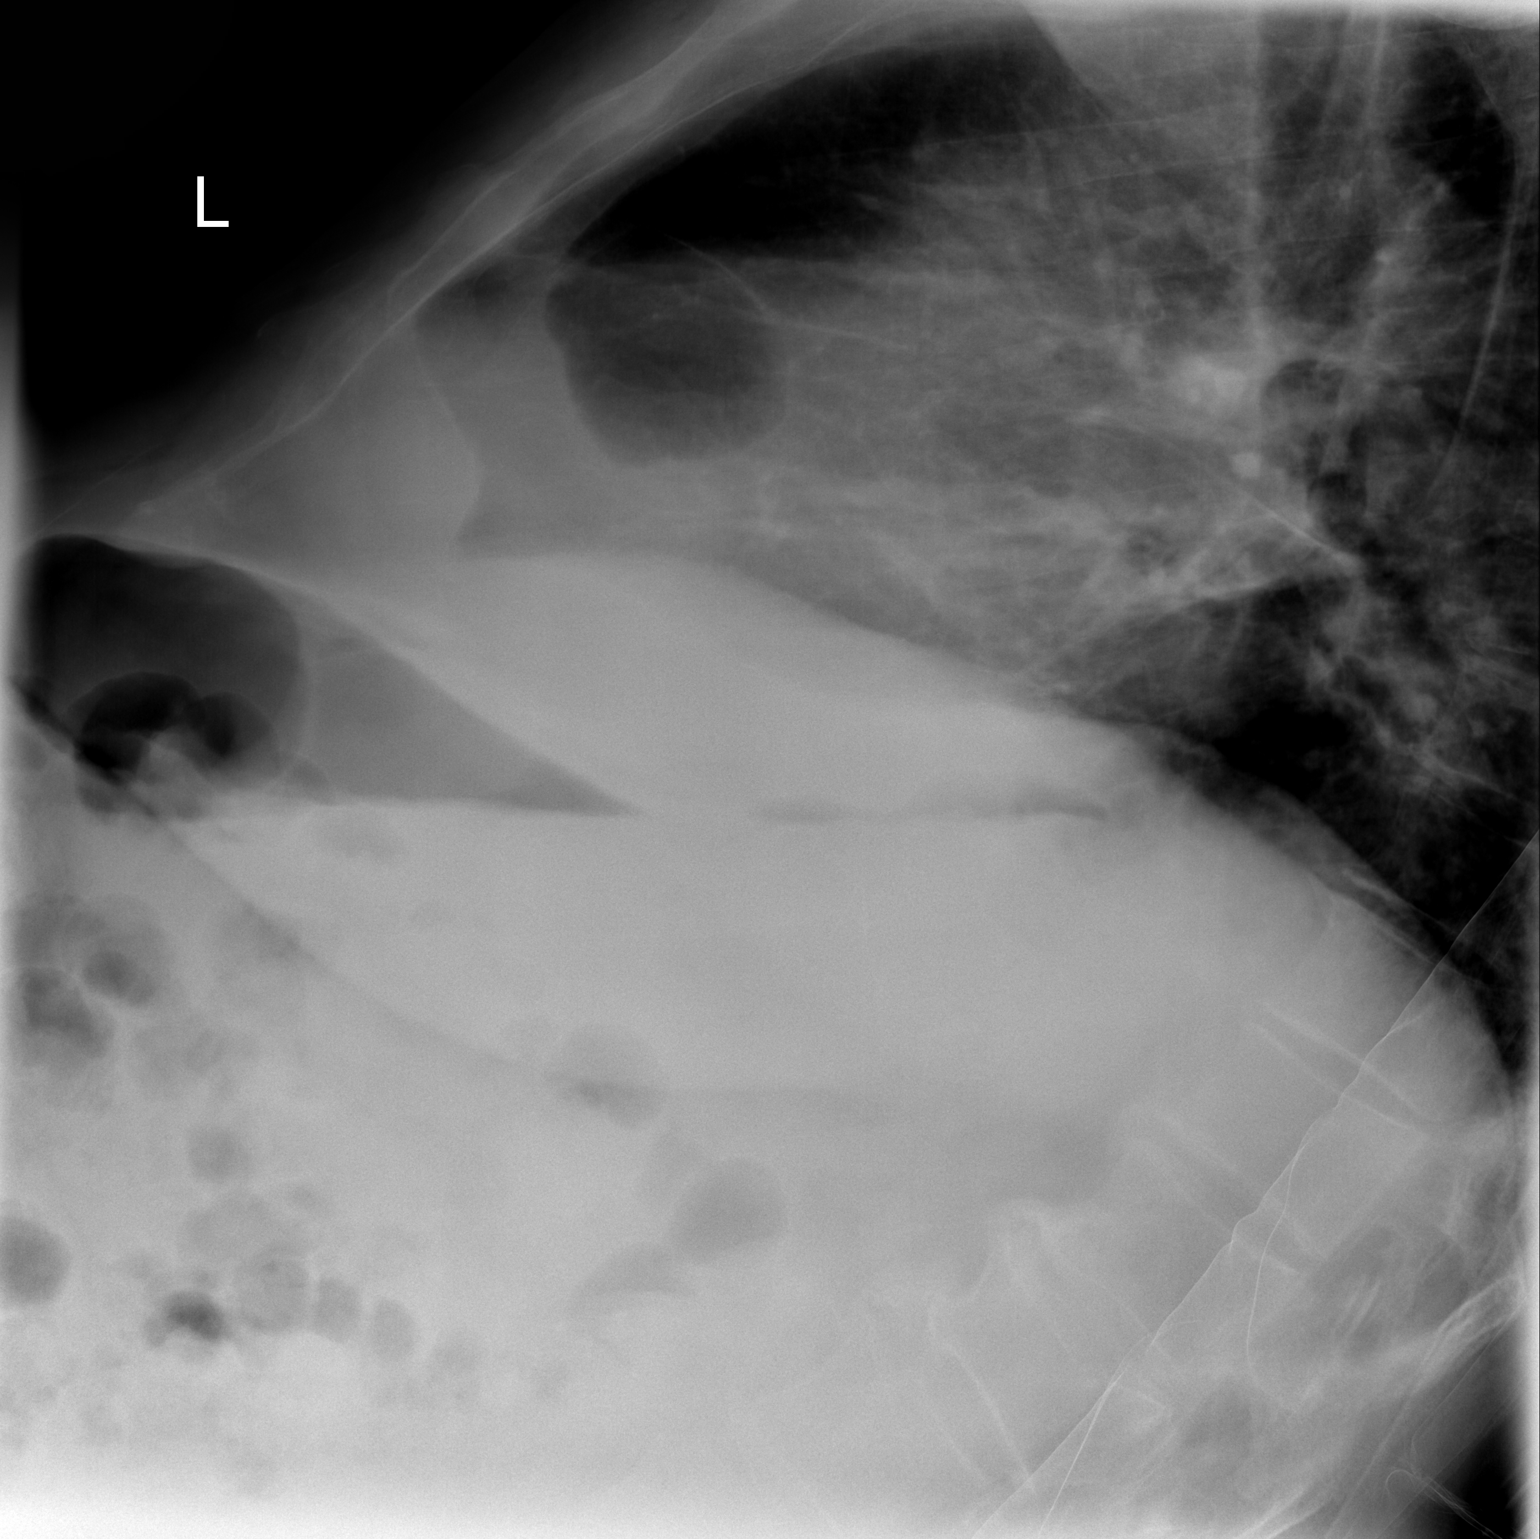

[2 of 2 positions shown; findings below may reference images not displayed]

FINDINGS: Cardiomegaly is unchanged. Lung volumes are mildly reduced. Bulla is
present over the cardiopericardial silhouette on the lateral view.
Spinal stimulator lead terminates over lower cervical ACDF plate.
Thoracolumbar compression fracture is present which appears chronic.

No airspace disease or effusion identified.

Likely chronic LEFT-sided rib fractures again noted.
IMPRESSION: Cardiomegaly without failure.
